# Patient Record
Sex: Female | Born: 1975 | Race: Black or African American | Hispanic: No | Marital: Single | Smoking: Never smoker
Health system: Southern US, Community
[De-identification: ages and names within clinical notes are randomized; demographics above are authoritative.]

## PROBLEM LIST (undated history)

## (undated) DIAGNOSIS — R011 Cardiac murmur, unspecified: Secondary | ICD-10-CM

## (undated) DIAGNOSIS — I10 Essential (primary) hypertension: Secondary | ICD-10-CM

## (undated) DIAGNOSIS — J302 Other seasonal allergic rhinitis: Secondary | ICD-10-CM

## (undated) DIAGNOSIS — G43909 Migraine, unspecified, not intractable, without status migrainosus: Secondary | ICD-10-CM

## (undated) DIAGNOSIS — S83209A Unspecified tear of unspecified meniscus, current injury, unspecified knee, initial encounter: Secondary | ICD-10-CM

## (undated) DIAGNOSIS — K219 Gastro-esophageal reflux disease without esophagitis: Secondary | ICD-10-CM

## (undated) HISTORY — PX: TRANSTHORACIC ECHOCARDIOGRAM: SHX275

---

## 1997-05-24 ENCOUNTER — Encounter: Admission: RE | Admit: 1997-05-24 | Discharge: 1997-05-24 | Payer: Self-pay | Admitting: Family Medicine

## 1997-06-05 ENCOUNTER — Encounter: Admission: RE | Admit: 1997-06-05 | Discharge: 1997-06-05 | Payer: Self-pay | Admitting: Family Medicine

## 1997-08-15 ENCOUNTER — Encounter: Admission: RE | Admit: 1997-08-15 | Discharge: 1997-08-15 | Payer: Self-pay | Admitting: Sports Medicine

## 1997-08-29 ENCOUNTER — Encounter: Admission: RE | Admit: 1997-08-29 | Discharge: 1997-08-29 | Payer: Self-pay | Admitting: Family Medicine

## 1998-01-16 ENCOUNTER — Encounter: Admission: RE | Admit: 1998-01-16 | Discharge: 1998-01-16 | Payer: Self-pay | Admitting: Family Medicine

## 1998-02-02 ENCOUNTER — Encounter: Admission: RE | Admit: 1998-02-02 | Discharge: 1998-02-02 | Payer: Self-pay | Admitting: Family Medicine

## 1998-03-20 ENCOUNTER — Encounter: Admission: RE | Admit: 1998-03-20 | Discharge: 1998-03-20 | Payer: Self-pay | Admitting: Family Medicine

## 1998-06-11 ENCOUNTER — Other Ambulatory Visit: Admission: RE | Admit: 1998-06-11 | Discharge: 1998-06-11 | Payer: Self-pay | Admitting: *Deleted

## 1998-07-13 ENCOUNTER — Encounter: Admission: RE | Admit: 1998-07-13 | Discharge: 1998-07-13 | Payer: Self-pay | Admitting: Family Medicine

## 1999-01-24 ENCOUNTER — Encounter: Admission: RE | Admit: 1999-01-24 | Discharge: 1999-01-24 | Payer: Self-pay | Admitting: Family Medicine

## 1999-02-27 ENCOUNTER — Encounter: Admission: RE | Admit: 1999-02-27 | Discharge: 1999-02-27 | Payer: Self-pay | Admitting: Family Medicine

## 1999-06-24 ENCOUNTER — Other Ambulatory Visit: Admission: RE | Admit: 1999-06-24 | Discharge: 1999-06-24 | Payer: Self-pay | Admitting: Obstetrics and Gynecology

## 1999-07-08 ENCOUNTER — Encounter: Admission: RE | Admit: 1999-07-08 | Discharge: 1999-07-08 | Payer: Self-pay | Admitting: Sports Medicine

## 1999-07-10 ENCOUNTER — Encounter: Admission: RE | Admit: 1999-07-10 | Discharge: 1999-07-10 | Payer: Self-pay | Admitting: Family Medicine

## 2000-05-18 ENCOUNTER — Encounter: Admission: RE | Admit: 2000-05-18 | Discharge: 2000-05-18 | Payer: Self-pay | Admitting: Family Medicine

## 2000-05-22 ENCOUNTER — Encounter: Admission: RE | Admit: 2000-05-22 | Discharge: 2000-05-22 | Payer: Self-pay | Admitting: Family Medicine

## 2000-06-22 ENCOUNTER — Encounter: Admission: RE | Admit: 2000-06-22 | Discharge: 2000-06-22 | Payer: Self-pay | Admitting: Family Medicine

## 2000-08-14 ENCOUNTER — Other Ambulatory Visit: Admission: RE | Admit: 2000-08-14 | Discharge: 2000-08-14 | Payer: Self-pay | Admitting: Obstetrics and Gynecology

## 2000-10-16 ENCOUNTER — Ambulatory Visit (HOSPITAL_BASED_OUTPATIENT_CLINIC_OR_DEPARTMENT_OTHER): Admission: RE | Admit: 2000-10-16 | Discharge: 2000-10-16 | Payer: Self-pay | Admitting: Otolaryngology

## 2000-10-16 HISTORY — PX: NASAL SINUS SURGERY: SHX719

## 2001-09-03 ENCOUNTER — Other Ambulatory Visit: Admission: RE | Admit: 2001-09-03 | Discharge: 2001-09-03 | Payer: Self-pay | Admitting: Obstetrics and Gynecology

## 2002-09-08 ENCOUNTER — Encounter: Admission: RE | Admit: 2002-09-08 | Discharge: 2002-09-08 | Payer: Self-pay | Admitting: Family Medicine

## 2003-02-01 ENCOUNTER — Encounter: Admission: RE | Admit: 2003-02-01 | Discharge: 2003-02-01 | Payer: Self-pay | Admitting: Family Medicine

## 2003-05-24 ENCOUNTER — Encounter: Admission: RE | Admit: 2003-05-24 | Discharge: 2003-05-24 | Payer: Self-pay | Admitting: Family Medicine

## 2003-12-01 ENCOUNTER — Ambulatory Visit: Payer: Self-pay | Admitting: Family Medicine

## 2004-11-20 ENCOUNTER — Encounter (INDEPENDENT_AMBULATORY_CARE_PROVIDER_SITE_OTHER): Payer: Self-pay | Admitting: *Deleted

## 2004-11-20 LAB — CONVERTED CEMR LAB

## 2004-11-25 ENCOUNTER — Ambulatory Visit: Payer: Self-pay | Admitting: Family Medicine

## 2004-11-25 ENCOUNTER — Encounter (INDEPENDENT_AMBULATORY_CARE_PROVIDER_SITE_OTHER): Payer: Self-pay | Admitting: *Deleted

## 2004-11-26 ENCOUNTER — Ambulatory Visit (HOSPITAL_COMMUNITY): Admission: RE | Admit: 2004-11-26 | Discharge: 2004-11-26 | Payer: Self-pay | Admitting: Family Medicine

## 2004-11-28 ENCOUNTER — Inpatient Hospital Stay (HOSPITAL_COMMUNITY): Admission: AD | Admit: 2004-11-28 | Discharge: 2004-11-28 | Payer: Self-pay | Admitting: Obstetrics and Gynecology

## 2004-12-02 ENCOUNTER — Ambulatory Visit: Payer: Self-pay | Admitting: Family Medicine

## 2004-12-03 ENCOUNTER — Inpatient Hospital Stay (HOSPITAL_COMMUNITY): Admission: AD | Admit: 2004-12-03 | Discharge: 2004-12-03 | Payer: Self-pay | Admitting: *Deleted

## 2004-12-03 ENCOUNTER — Ambulatory Visit: Payer: Self-pay | Admitting: *Deleted

## 2004-12-03 ENCOUNTER — Encounter: Admission: RE | Admit: 2004-12-03 | Discharge: 2004-12-03 | Payer: Self-pay | Admitting: Sports Medicine

## 2004-12-04 ENCOUNTER — Encounter (INDEPENDENT_AMBULATORY_CARE_PROVIDER_SITE_OTHER): Payer: Self-pay | Admitting: Specialist

## 2004-12-04 ENCOUNTER — Ambulatory Visit: Payer: Self-pay | Admitting: Obstetrics and Gynecology

## 2004-12-04 ENCOUNTER — Ambulatory Visit (HOSPITAL_COMMUNITY): Admission: RE | Admit: 2004-12-04 | Discharge: 2004-12-04 | Payer: Self-pay | Admitting: Obstetrics and Gynecology

## 2004-12-04 HISTORY — PX: OTHER SURGICAL HISTORY: SHX169

## 2004-12-24 ENCOUNTER — Ambulatory Visit: Payer: Self-pay | Admitting: Sports Medicine

## 2004-12-27 ENCOUNTER — Ambulatory Visit: Payer: Self-pay | Admitting: Family Medicine

## 2005-05-20 ENCOUNTER — Ambulatory Visit: Payer: Self-pay | Admitting: Family Medicine

## 2005-08-19 ENCOUNTER — Ambulatory Visit: Payer: Self-pay | Admitting: Family Medicine

## 2005-09-16 ENCOUNTER — Ambulatory Visit: Payer: Self-pay | Admitting: Family Medicine

## 2006-03-19 DIAGNOSIS — J309 Allergic rhinitis, unspecified: Secondary | ICD-10-CM | POA: Insufficient documentation

## 2006-03-20 ENCOUNTER — Encounter (INDEPENDENT_AMBULATORY_CARE_PROVIDER_SITE_OTHER): Payer: Self-pay | Admitting: *Deleted

## 2007-02-22 ENCOUNTER — Encounter: Payer: Self-pay | Admitting: Family Medicine

## 2007-02-22 ENCOUNTER — Ambulatory Visit: Payer: Self-pay | Admitting: Family Medicine

## 2007-02-22 LAB — CONVERTED CEMR LAB
ALT: 10 units/L (ref 0–35)
AST: 12 units/L (ref 0–37)
Albumin: 4.2 g/dL (ref 3.5–5.2)
Alkaline Phosphatase: 84 units/L (ref 39–117)
BUN: 13 mg/dL (ref 6–23)
CO2: 20 meq/L (ref 19–32)
Calcium: 9.6 mg/dL (ref 8.4–10.5)
Chloride: 108 meq/L (ref 96–112)
Cholesterol: 156 mg/dL (ref 0–200)
Creatinine, Ser: 0.85 mg/dL (ref 0.40–1.20)
Glucose, Bld: 90 mg/dL (ref 70–99)
HDL: 50 mg/dL (ref >39)
LDL Cholesterol: 91 mg/dL (ref 0–99)
Potassium: 4.1 meq/L (ref 3.5–5.3)
Sodium: 140 meq/L (ref 135–145)
TSH: 1.085 microintl units/mL (ref 0.350–5.50)
Total Bilirubin: 0.4 mg/dL (ref 0.3–1.2)
Total CHOL/HDL Ratio: 3.1
Total Protein: 7.3 g/dL (ref 6.0–8.3)
Triglycerides: 76 mg/dL (ref <150)
VLDL: 15 mg/dL (ref 0–40)

## 2007-02-24 ENCOUNTER — Encounter: Payer: Self-pay | Admitting: Family Medicine

## 2007-04-09 ENCOUNTER — Telehealth: Payer: Self-pay | Admitting: *Deleted

## 2007-05-21 HISTORY — PX: KNEE ARTHROSCOPY: SUR90

## 2007-07-29 ENCOUNTER — Ambulatory Visit: Payer: Self-pay | Admitting: Family Medicine

## 2008-01-11 ENCOUNTER — Ambulatory Visit: Payer: Self-pay | Admitting: Family Medicine

## 2008-01-11 ENCOUNTER — Telehealth: Payer: Self-pay | Admitting: Family Medicine

## 2008-03-06 ENCOUNTER — Encounter: Payer: Self-pay | Admitting: Family Medicine

## 2008-03-06 ENCOUNTER — Ambulatory Visit: Payer: Self-pay | Admitting: Family Medicine

## 2008-03-10 ENCOUNTER — Telehealth: Payer: Self-pay | Admitting: *Deleted

## 2008-03-28 ENCOUNTER — Encounter: Payer: Self-pay | Admitting: Family Medicine

## 2008-03-28 ENCOUNTER — Ambulatory Visit: Payer: Self-pay | Admitting: Family Medicine

## 2008-04-21 ENCOUNTER — Encounter: Payer: Self-pay | Admitting: Family Medicine

## 2008-06-12 ENCOUNTER — Encounter: Payer: Self-pay | Admitting: Family Medicine

## 2008-09-26 ENCOUNTER — Telehealth: Payer: Self-pay | Admitting: Family Medicine

## 2008-10-02 ENCOUNTER — Encounter: Payer: Self-pay | Admitting: Family Medicine

## 2008-10-02 ENCOUNTER — Ambulatory Visit: Payer: Self-pay | Admitting: Family Medicine

## 2008-10-02 LAB — CONVERTED CEMR LAB
Antibody Screen: NEGATIVE
Basophils Absolute: 0 10*3/uL (ref 0.0–0.1)
Basophils Relative: 0 % (ref 0–1)
Eosinophils Absolute: 0.2 10*3/uL (ref 0.0–0.7)
Eosinophils Relative: 3 % (ref 0–5)
HCT: 34.5 % — ABNORMAL LOW (ref 36.0–46.0)
Hemoglobin: 10.2 g/dL — ABNORMAL LOW (ref 12.0–15.0)
Hepatitis B Surface Ag: NEGATIVE
Lymphocytes Relative: 29 % (ref 12–46)
Lymphs Abs: 1.7 10*3/uL (ref 0.7–4.0)
MCHC: 29.6 g/dL — ABNORMAL LOW (ref 30.0–36.0)
MCV: 73.7 fL — ABNORMAL LOW (ref 78.0–100.0)
Monocytes Absolute: 0.3 10*3/uL (ref 0.1–1.0)
Monocytes Relative: 5 % (ref 3–12)
Neutro Abs: 3.6 10*3/uL (ref 1.7–7.7)
Neutrophils Relative %: 63 % (ref 43–77)
Platelets: 322 10*3/uL (ref 150–400)
RBC: 4.68 M/uL (ref 3.87–5.11)
RDW: 17.2 % — ABNORMAL HIGH (ref 11.5–15.5)
Rh Type: POSITIVE
Rubella: 38.8 intl units/mL — ABNORMAL HIGH
Sickle Cell Screen: NEGATIVE
WBC: 5.8 10*3/uL (ref 4.0–10.5)

## 2008-10-03 ENCOUNTER — Encounter: Payer: Self-pay | Admitting: Family Medicine

## 2008-10-09 ENCOUNTER — Other Ambulatory Visit: Admission: RE | Admit: 2008-10-09 | Discharge: 2008-10-09 | Payer: Self-pay | Admitting: Family Medicine

## 2008-10-09 ENCOUNTER — Encounter: Payer: Self-pay | Admitting: Family Medicine

## 2008-10-09 ENCOUNTER — Ambulatory Visit: Payer: Self-pay | Admitting: Family Medicine

## 2008-10-09 DIAGNOSIS — A6000 Herpesviral infection of urogenital system, unspecified: Secondary | ICD-10-CM | POA: Insufficient documentation

## 2008-10-09 LAB — CONVERTED CEMR LAB
Bilirubin Urine: NEGATIVE
Chlamydia, DNA Probe: NEGATIVE
GC Probe Amp, Genital: NEGATIVE
Glucose, Urine, Semiquant: NEGATIVE
Ketones, urine, test strip: NEGATIVE
Nitrite: NEGATIVE
Protein, U semiquant: NEGATIVE
Specific Gravity, Urine: 1.015
Urobilinogen, UA: 0.2
WBC Urine, dipstick: NEGATIVE
pH: 7

## 2008-10-11 ENCOUNTER — Encounter: Payer: Self-pay | Admitting: Family Medicine

## 2008-10-13 ENCOUNTER — Encounter: Payer: Self-pay | Admitting: Family Medicine

## 2008-10-13 ENCOUNTER — Ambulatory Visit (HOSPITAL_COMMUNITY): Admission: RE | Admit: 2008-10-13 | Discharge: 2008-10-13 | Payer: Self-pay | Admitting: Family Medicine

## 2008-10-19 ENCOUNTER — Telehealth: Payer: Self-pay | Admitting: *Deleted

## 2008-11-07 ENCOUNTER — Encounter: Payer: Self-pay | Admitting: Family Medicine

## 2008-11-07 ENCOUNTER — Ambulatory Visit: Payer: Self-pay | Admitting: Family Medicine

## 2008-11-07 DIAGNOSIS — K219 Gastro-esophageal reflux disease without esophagitis: Secondary | ICD-10-CM

## 2008-11-09 ENCOUNTER — Telehealth: Payer: Self-pay | Admitting: Family Medicine

## 2008-11-13 ENCOUNTER — Ambulatory Visit (HOSPITAL_COMMUNITY): Admission: RE | Admit: 2008-11-13 | Discharge: 2008-11-13 | Payer: Self-pay | Admitting: Family Medicine

## 2008-11-13 ENCOUNTER — Encounter: Payer: Self-pay | Admitting: Family Medicine

## 2008-11-14 ENCOUNTER — Encounter: Payer: Self-pay | Admitting: Family Medicine

## 2008-11-27 ENCOUNTER — Telehealth: Payer: Self-pay | Admitting: Family Medicine

## 2008-11-27 ENCOUNTER — Encounter: Payer: Self-pay | Admitting: *Deleted

## 2008-12-04 ENCOUNTER — Encounter: Payer: Self-pay | Admitting: Family Medicine

## 2008-12-04 ENCOUNTER — Ambulatory Visit: Payer: Self-pay | Admitting: Family Medicine

## 2008-12-11 ENCOUNTER — Ambulatory Visit (HOSPITAL_COMMUNITY): Admission: RE | Admit: 2008-12-11 | Discharge: 2008-12-11 | Payer: Self-pay | Admitting: Family Medicine

## 2008-12-13 ENCOUNTER — Ambulatory Visit: Payer: Self-pay | Admitting: Family Medicine

## 2008-12-13 ENCOUNTER — Encounter: Payer: Self-pay | Admitting: Family Medicine

## 2008-12-18 ENCOUNTER — Telehealth: Payer: Self-pay | Admitting: *Deleted

## 2009-01-02 ENCOUNTER — Ambulatory Visit: Payer: Self-pay | Admitting: Family Medicine

## 2009-01-02 ENCOUNTER — Encounter: Payer: Self-pay | Admitting: Family Medicine

## 2009-01-10 ENCOUNTER — Encounter: Payer: Self-pay | Admitting: Family Medicine

## 2009-01-10 ENCOUNTER — Ambulatory Visit (HOSPITAL_COMMUNITY): Admission: RE | Admit: 2009-01-10 | Discharge: 2009-01-10 | Payer: Self-pay | Admitting: Family Medicine

## 2009-01-22 ENCOUNTER — Inpatient Hospital Stay (HOSPITAL_COMMUNITY): Admission: AD | Admit: 2009-01-22 | Discharge: 2009-01-22 | Payer: Self-pay | Admitting: Obstetrics and Gynecology

## 2009-01-22 ENCOUNTER — Ambulatory Visit: Payer: Self-pay | Admitting: Obstetrics and Gynecology

## 2009-01-31 ENCOUNTER — Ambulatory Visit: Payer: Self-pay | Admitting: Family Medicine

## 2009-01-31 ENCOUNTER — Encounter: Payer: Self-pay | Admitting: Family Medicine

## 2009-02-22 ENCOUNTER — Telehealth: Payer: Self-pay | Admitting: Family Medicine

## 2009-02-27 ENCOUNTER — Ambulatory Visit: Payer: Self-pay | Admitting: Family Medicine

## 2009-02-27 ENCOUNTER — Encounter: Payer: Self-pay | Admitting: Family Medicine

## 2009-02-27 LAB — CONVERTED CEMR LAB
HCT: 37.1 % (ref 36.0–46.0)
Hemoglobin: 12.2 g/dL (ref 12.0–15.0)
MCHC: 32.9 g/dL (ref 30.0–36.0)
MCV: 85.5 fL (ref 78.0–100.0)
Platelets: 219 10*3/uL (ref 150–400)
RBC: 4.34 M/uL (ref 3.87–5.11)
RDW: 14.5 % (ref 11.5–15.5)
WBC: 7.8 10*3/uL (ref 4.0–10.5)

## 2009-03-16 ENCOUNTER — Ambulatory Visit: Payer: Self-pay | Admitting: Family Medicine

## 2009-03-27 ENCOUNTER — Telehealth: Payer: Self-pay | Admitting: Family Medicine

## 2009-04-03 ENCOUNTER — Ambulatory Visit: Payer: Self-pay | Admitting: Family Medicine

## 2009-04-17 ENCOUNTER — Ambulatory Visit: Payer: Self-pay | Admitting: Family Medicine

## 2009-04-24 ENCOUNTER — Ambulatory Visit: Payer: Self-pay | Admitting: Family Medicine

## 2009-04-24 ENCOUNTER — Inpatient Hospital Stay (HOSPITAL_COMMUNITY): Admission: AD | Admit: 2009-04-24 | Discharge: 2009-04-24 | Payer: Self-pay | Admitting: Obstetrics & Gynecology

## 2009-05-02 ENCOUNTER — Ambulatory Visit: Payer: Self-pay | Admitting: Family Medicine

## 2009-05-02 ENCOUNTER — Encounter: Payer: Self-pay | Admitting: Family Medicine

## 2009-05-02 LAB — CONVERTED CEMR LAB
Chlamydia, DNA Probe: NEGATIVE
GC Probe Amp, Genital: NEGATIVE
Glucose, Urine, Semiquant: NEGATIVE
Protein, U semiquant: NEGATIVE

## 2009-05-10 ENCOUNTER — Ambulatory Visit: Payer: Self-pay | Admitting: Family Medicine

## 2009-05-14 ENCOUNTER — Inpatient Hospital Stay (HOSPITAL_COMMUNITY): Admission: AD | Admit: 2009-05-14 | Discharge: 2009-05-15 | Payer: Self-pay | Admitting: Family Medicine

## 2009-05-14 ENCOUNTER — Telehealth: Payer: Self-pay | Admitting: Family Medicine

## 2009-05-15 ENCOUNTER — Encounter: Payer: Self-pay | Admitting: Family Medicine

## 2009-05-16 ENCOUNTER — Inpatient Hospital Stay (HOSPITAL_COMMUNITY): Admission: AD | Admit: 2009-05-16 | Discharge: 2009-05-18 | Payer: Self-pay | Admitting: Obstetrics and Gynecology

## 2009-05-16 ENCOUNTER — Ambulatory Visit: Payer: Self-pay | Admitting: Family

## 2009-05-22 ENCOUNTER — Inpatient Hospital Stay (HOSPITAL_COMMUNITY): Admission: AD | Admit: 2009-05-22 | Discharge: 2009-05-25 | Payer: Self-pay | Admitting: Family Medicine

## 2009-05-22 ENCOUNTER — Ambulatory Visit: Payer: Self-pay | Admitting: Family Medicine

## 2009-05-22 ENCOUNTER — Encounter: Payer: Self-pay | Admitting: Family Medicine

## 2009-05-31 ENCOUNTER — Ambulatory Visit: Payer: Self-pay | Admitting: Family Medicine

## 2009-06-15 ENCOUNTER — Telehealth: Payer: Self-pay | Admitting: Family Medicine

## 2009-07-02 ENCOUNTER — Ambulatory Visit: Payer: Self-pay | Admitting: Family Medicine

## 2009-07-25 ENCOUNTER — Telehealth: Payer: Self-pay | Admitting: Family Medicine

## 2009-08-03 ENCOUNTER — Ambulatory Visit: Payer: Self-pay | Admitting: Family Medicine

## 2009-09-26 ENCOUNTER — Ambulatory Visit: Payer: Self-pay | Admitting: Family Medicine

## 2009-09-26 DIAGNOSIS — L600 Ingrowing nail: Secondary | ICD-10-CM

## 2009-10-11 ENCOUNTER — Ambulatory Visit: Payer: Self-pay | Admitting: Family Medicine

## 2009-10-15 ENCOUNTER — Telehealth: Payer: Self-pay | Admitting: Family Medicine

## 2009-10-31 ENCOUNTER — Ambulatory Visit: Payer: Self-pay | Admitting: Family Medicine

## 2009-10-31 LAB — CONVERTED CEMR LAB: Pap Smear: NEGATIVE

## 2009-11-06 ENCOUNTER — Encounter: Payer: Self-pay | Admitting: Family Medicine

## 2009-11-23 ENCOUNTER — Telehealth: Payer: Self-pay | Admitting: Family Medicine

## 2009-11-23 ENCOUNTER — Encounter: Payer: Self-pay | Admitting: Family Medicine

## 2009-12-31 ENCOUNTER — Telehealth (INDEPENDENT_AMBULATORY_CARE_PROVIDER_SITE_OTHER): Payer: Self-pay | Admitting: Family Medicine

## 2010-01-17 ENCOUNTER — Ambulatory Visit: Payer: Self-pay | Admitting: Family Medicine

## 2010-01-17 ENCOUNTER — Encounter: Payer: Self-pay | Admitting: Family Medicine

## 2010-01-31 ENCOUNTER — Ambulatory Visit: Admission: RE | Admit: 2010-01-31 | Discharge: 2010-01-31 | Payer: Self-pay | Source: Home / Self Care

## 2010-01-31 DIAGNOSIS — J0100 Acute maxillary sinusitis, unspecified: Secondary | ICD-10-CM | POA: Insufficient documentation

## 2010-02-10 ENCOUNTER — Encounter: Payer: Self-pay | Admitting: Family Medicine

## 2010-02-19 NOTE — Assessment & Plan Note (Signed)
Summary: pap & depo/eo   Vital Signs:  Patient profile:   35 year old female Height:      61.5 inches Weight:      225.4 pounds BMI:     42.05 Temp:     99.4 degrees F oral Pulse rate:   80 / minute BP sitting:   104 / 71  (left arm) Cuff size:   large  Vitals Entered By: Garen Grams LPN (October 31, 2009 2:05 PM) CC: CPP Is Patient Diabetic? No Pain Assessment Patient in pain? no        Primary Care Provider:  Angelena Sole MD  CC:  CPP.  History of Present Illness: Physical exam  Questions / Concerns 1. Ingrown toenails:  overall doing much better 2. GERD: taking Zantac at night 3 times / week for acid reflux.  This helps.  Medical issues 1. HTN: taking her medicines as prescribed  Health Maintanence 1. Due for pap smear 2. Balanced diet 3. Doesn't exercise 4. Managing stress  Habits & Providers  Alcohol-Tobacco-Diet     Tobacco Status: never     Cigarette Packs/Day: n/a  Current Medications (verified): 1)  Acyclovir 400 Mg Tabs (Acyclovir) .Marland Kitchen.. 1 Tab By Mouth Three Times A Day Starting At [redacted] Weeks Gestation 2)  P D Natal Vitamins/folic Acid  Tabs (Prenatal Multivit-Min-Fe-Fa) .... Take One Tablet By Mouth Daily 3)  Fioricet 50-325-40 Mg Tabs (Butalbital-Apap-Caffeine) .Marland Kitchen.. 1-2 Tabs Q4h As Needed For Migraine Headache, No More Than 6 Tabs Per Day 4)  Fluticasone Propionate 50 Mcg/act Susp (Fluticasone Propionate) .... 2 Sprays Each Nostril Once Daily 5)  Zyrtec Allergy 10 Mg Tabs (Cetirizine Hcl) .Marland Kitchen.. 1 Tab By Mouth Daily For Allergies 6)  Ferrous Sulfate 325 (65 Fe) Mg Tabs (Ferrous Sulfate) .Marland Kitchen.. 1 Tab By Mouth 1-2 Times A Day For Anemia 7)  Zantac 150 Maximum Strength 150 Mg Tabs (Ranitidine Hcl) .... Take 1 Tab By Mouth Daily 8)  Hydrochlorothiazide 25 Mg Tabs (Hydrochlorothiazide) .... Take 1 Tab By Mouth Daily 9)  Labetalol Hcl 200 Mg Tabs (Labetalol Hcl) .Marland Kitchen.. 1 Tab By Mouth Two Times A Day  Allergies: No Known Drug Allergies  Past  History:  Past Medical History: Reviewed history from 07/02/2009 and no changes required. HTN hx abnormal pap,  Steroid injection of R carpal tunnel 1/05, 5/05  Social History: Reviewed history from 07/02/2009 and no changes required. 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  The patient denies fever, weight loss, decreased hearing, hoarseness, chest pain, dyspnea on exertion, peripheral edema, prolonged cough, headaches, and abdominal pain.    Physical Exam  General:  vitals reviewed.  obese. no acute distress Eyes:  vision grossly intact.  PERRL Nose:  no external deformity and no nasal discharge.   Mouth:  good dentition and pharynx pink and moist.   Neck:  supple, full ROM, and no masses.   Lungs:  normal respiratory effort.   Heart:  normal rate and regular rhythm.   Soft systolic murmur Abdomen:  S / NT / ND.  +BS Genitalia:  normal introitus, no external lesions, no vaginal discharge, and mucosa pink and moist.   Msk:  normal ROM, no joint tenderness, and no joint swelling.   Extremities:  no lower extremity edema Neurologic:  alert & oriented X3, cranial nerves II-XII intact, strength normal in all extremities,  and sensation intact to light touch.   Skin:  turgor normal and color normal.   Psych:  normally interactive, good eye contact, not anxious appearing, and not depressed appearing.     Impression & Recommendations:  Problem # 1:  Preventive Health Care (ICD-V70.0) Assessment Unchanged Doing well.  Routine care.  PAP smear performed today  Problem # 2:  INGROWN TOENAIL, INFECTED (ICD-703.0) Assessment: Improved continue to monitor  Problem # 3:  GERD (ICD-530.81) Assessment: Deteriorated continue Zantac for now.  May need Omeprazole. Her updated medication list for this problem includes:    Zantac 150 Maximum  Strength 150 Mg Tabs (Ranitidine hcl) .Marland Kitchen... Take 1 tab by mouth daily  Complete Medication List: 1)  Acyclovir 400 Mg Tabs (Acyclovir) .Marland Kitchen.. 1 tab by mouth three times a day starting at [redacted] weeks gestation 2)  P D Natal Vitamins/folic Acid Tabs (Prenatal multivit-min-fe-fa) .... Take one tablet by mouth daily 3)  Fioricet 50-325-40 Mg Tabs (Butalbital-apap-caffeine) .Marland Kitchen.. 1-2 tabs q4h as needed for migraine headache, no more than 6 tabs per day 4)  Fluticasone Propionate 50 Mcg/act Susp (Fluticasone propionate) .... 2 sprays each nostril once daily 5)  Zyrtec Allergy 10 Mg Tabs (Cetirizine hcl) .Marland Kitchen.. 1 tab by mouth daily for allergies 6)  Ferrous Sulfate 325 (65 Fe) Mg Tabs (Ferrous sulfate) .Marland Kitchen.. 1 tab by mouth 1-2 times a day for anemia 7)  Zantac 150 Maximum Strength 150 Mg Tabs (Ranitidine hcl) .... Take 1 tab by mouth daily 8)  Hydrochlorothiazide 25 Mg Tabs (Hydrochlorothiazide) .... Take 1 tab by mouth daily 9)  Labetalol Hcl 200 Mg Tabs (Labetalol hcl) .Marland Kitchen.. 1 tab by mouth two times a day  Other Orders: Pap Smear-[facility] ([address]109-60454) Depo-Provera 150mg  (J1055) [facility] - Est  18-39 yrs (09811)   Medication Administration  Injection # 1:    Medication: Depo-Provera 150mg     Diagnosis: CONTRACEPTIVE MANAGEMENT (ICD-V25.09)    Route: IM    Site: LUOQ gluteus    Exp Date: 03/20/2012    Lot #: [id number]    Mfr: Francisca December    Comments: Next Depo Due: December 12 - January 11    Patient tolerated injection without complications    Given by: Garen Grams LPN (October 31, 2009 2:51 PM)  Orders Added: 1)  Pap Smear-[facility] [[id number]] 2)  Depo-Provera 150mg  [J1055] 3)  [facility] - Est  18-39 yrs [78469]

## 2010-02-19 NOTE — Assessment & Plan Note (Signed)
Summary: ob visit/eo   Vital Signs:  Patient profile:   35 year old female Weight:      227 pounds BP sitting:   130 / 77  Vitals Entered By: Jone Baseman CMA (March 16, 2009 9:43 AM)  Primary Care Provider:  Norton Blizzard MD   History of Present Illness: 1. Carpal Tunnel Syndrome:  Seems to be getting worse.  Purchased two wrist braces to use during work and at night.  These seem to help a little bit.  She is also taking Tylenol but only as needed.  2. Sinusitis:  Improved.  Still having some nasal congestion but this is thought to be due to  3. Nipple leakage:  Has started to notice some fluid starting to come from her breasts.  Her breasts also feel a lot larger.  4. Pregnancy:   -Epidural -Breast feed -Micronor (Doesn't want IUD or Depo) -Wants baby to follow up with [facility]  Habits & Providers  Alcohol-Tobacco-Diet     Cigarette Packs/Day: n/a  Current Medications (verified): 1)  Acyclovir 400 Mg Tabs (Acyclovir) .Marland Kitchen.. 1 Tab By Mouth Three Times A Day Starting At [redacted] Weeks Gestation 2)  P D Natal Vitamins/folic Acid  Tabs (Prenatal Multivit-Min-Fe-Fa) .... Take One Tablet By Mouth Daily 3)  Fioricet 50-325-40 Mg Tabs (Butalbital-Apap-Caffeine) .Marland Kitchen.. 1-2 Tabs Q4h As Needed For Migraine Headache, No More Than 6 Tabs Per Day 4)  Fluticasone Propionate 50 Mcg/act Susp (Fluticasone Propionate) .... 2 Sprays Each Nostril Once Daily 5)  Zyrtec Allergy 10 Mg Tabs (Cetirizine Hcl) .Marland Kitchen.. 1 Tab By Mouth Daily For Allergies 6)  Ferrous Sulfate 325 (65 Fe) Mg Tabs (Ferrous Sulfate) .Marland Kitchen.. 1 Tab By Mouth 1-2 Times A Day For Anemia 7)  Zantac 150 Maximum Strength 150 Mg Tabs (Ranitidine Hcl) .... Take 1 Tab By Mouth Daily 8)  Amoxicillin 500 Mg Tabs (Amoxicillin) .... Take 1 Tab By Mouth Two Times A Day For 10 Days  Allergies: No Known Drug Allergies  Past History:  Past Medical History: Reviewed history from 03/06/2008 and no changes required. hx abnormal pap,    Steroid injection of R carpal tunnel 1/05, 5/05  Social History: Reviewed history from 03/19/2006 and no changes required. 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   Mouth:  good dentition and pharynx pink and moist.   Neck:  supple and no masses.   Lungs:  normal respiratory effort, normal breath sounds, and no wheezes.   Heart:  normal rate, regular rhythm, and no murmur.   Abdomen:  S / NT / ND  Gravid c/w dates Msk:  bilateral wrists + Tinnels Extremities:  trace lower extremity edema Skin:  turgor normal and color normal.   Psych:  not anxious appearing and not depressed appearing.     Impression & Recommendations:  Problem # 1:  PREGNANCY (ICD-V22.2) Assessment Unchanged Encouraged her to monitor her diet more closely.  She is gainig a little bit too much weight. Follow up in 2 weeks -Epidural -Breast feed -Micronor (Doesn't want IUD or Depo) -Wants baby to follow up with [facility]  Orders: Other OB visit- [facility] (OBCK)  Problem # 2:  CARPAL TUNNEL SYNDROME (ICD-354.0) Assessment: Deteriorated  Continue wrist braces / Tylenol.  Did not recommend injection at this time.  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  Now with just allergies. The following medications were removed from the medication list:    Amoxicillin 500 Mg Tabs (Amoxicillin) .Marland Kitchen... Take 1 tab by mouth two times a day for 10 days Her updated medication list for this problem includes:    Fluticasone Propionate 50 Mcg/act Susp (Fluticasone propionate) .Marland Kitchen... 2 sprays each nostril once daily  Orders: Other OB visit- [facility] (OBCK)  Complete Medication List: 1)  Acyclovir 400 Mg Tabs (Acyclovir) .Marland Kitchen.. 1 tab by mouth three times a day starting  at [redacted] weeks gestation 2)  P D Natal Vitamins/folic Acid Tabs (Prenatal multivit-min-fe-fa) .... Take one tablet by mouth daily 3)  Fioricet 50-325-40 Mg Tabs (Butalbital-apap-caffeine) .Marland Kitchen.. 1-2 tabs q4h as needed for migraine headache, no more than 6 tabs per day 4)  Fluticasone Propionate 50 Mcg/act Susp (Fluticasone propionate) .... 2 sprays each nostril once daily 5)  Zyrtec Allergy 10 Mg Tabs (Cetirizine hcl) .Marland Kitchen.. 1 tab by mouth daily for allergies 6)  Ferrous Sulfate 325 (65 Fe) Mg Tabs (Ferrous sulfate) .Marland Kitchen.. 1 tab by mouth 1-2 times a day for anemia 7)  Zantac 150 Maximum Strength 150 Mg Tabs (Ranitidine hcl) .... Take 1 tab by mouth daily  Patient Instructions: 1)  You are doing great 2)  We will not make any changes to your treatment today 3)  Continue to use the wrist braces and Tylenol.  You can take it up to every 4 hours. 4)  Please schedule a follow up appointment in 2 weeks.    Flowsheet View for Follow-up Visit    Estimated weeks of       gestation:     29 5/7    Weight:     227    Blood pressure:   130 / 77    Headache:     No    Nausea/vomiting:   No    Edema:     TrLE    Vaginal bleeding:   no    Vaginal discharge:   no    Fundal height:      30    FHR:       150    Fetal activity:     yes    Labor symptoms:   no    Taking prenatal vits?   Y    Smoking:     n/a    Next visit:     2 wk    Resident:     [name]    Preceptor:     Mauricio Po

## 2010-02-19 NOTE — Progress Notes (Signed)
Summary: triage  Phone Note Call from Patient Call back at [phone]   Caller: Patient Summary of Call: Her secretary has Shingles and was worried about the affect it would have on her since she is pregnant. Initial call taken by: Clydell Hakim,  March 27, 2009 1:44 PM  Follow-up for Phone Call        line remains busy Follow-up by: Golden Circle RN,  March 27, 2009 1:46 PM  Additional Follow-up for Phone Call Additional follow up Details #1::        her secretary has blisters that are covered by clothes. told her she was fine to work with her, just do not touch the fluid from the blisters Additional Follow-up by: Golden Circle RN,  March 27, 2009 1:56 PM

## 2010-02-19 NOTE — Assessment & Plan Note (Signed)
Summary: ob f/u   Flowsheet View for Follow-up Visit    Estimated weeks of       gestation:     27 2/7    Weight:     222    Blood pressure:   133 / 78    Hx headache?     No    Nausea/vomiting?   No    Edema?     0    Bleeding?     no    Leakage/discharge?   no    Fetal activity:       yes    Labor symptoms?   no    Fundal height:      27    FHR:       150    Taking Vitamins?   Y    Smoking PPD:   n/a    Comment:     Repeat 1 hour gtt    Next visit:     2 wk    Resident:     [name]    Preceptor:     Mauricio Po  Primary Care Provider:  Norton Blizzard MD   History of Present Illness: 1.Concerned that some of the kids in her school had CMV.  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  Reassured her that they are not active carrieres and that she can not get it from them  2. Carpal tunnel syndrome:  Is getting a little worse.  Has had steroid injections before.  Is wearing splints at night, not helping.  Does not want more medicine or injection at this point.  3. Sinusitis:  Has been dealing with sinus pain, pressure, and a cough for >1 week.  This happens to her about 2-3 times a year.  She tried to deal with it with OTC medicines but they don't seem to be helping.  She normally requires an antibiotic to clear the infection.   Physical Examination  Vital Signs:  BP (upright): 133/78  Wt: 222  Last Ht: 61.5 (10/09/2008)  General Exam:  Constitutional:    Well appering, obese Nose:    Maxillary sinus tenderness. External nasal examination shows no deformity or inflammation. Nasal mucosa are pink and moist without lesions or exudates. Mouth:    Oral mucosa and oropharynx without lesions or exudates.  Teeth in good repair. Cardiovascular:    Normal rate and regular rhythm. S1 and S2 normal without gallop, murmur, click, rub or other extra sounds. Respiratory:    no respiratory distress and clear to auscultation.   Abdomen:    S / NT/ gravid Extremities:    No lower  extremity edema  Impression & Recommendations:  Problem # 1:  PREGNANCY (ICD-V22.2) Repeat 1 hour GTT today and labs.  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 2 sprays each nostril once daily    Amoxicillin 500 Mg Tabs (Amoxicillin) .Marland Kitchen... Take 1 tab by mouth two times a day for 10 days  Orders: Other OB visit- [facility] (OBCK)  Problem # 3:  CARPAL TUNNEL SYNDROME (ICD-354.0) Assessment: Deteriorated  Continue to monitor.  Doesn't want other pain meds or injection.  Orders: Other OB visit- [facility] (OBCK)  Complete Medication List: 1)  Acyclovir  400 Mg Tabs (Acyclovir) .Marland Kitchen.. 1 tab by mouth three times a day starting at [redacted] weeks gestation 2)  P D Natal Vitamins/folic Acid Tabs (Prenatal multivit-min-fe-fa) .... Take one tablet by mouth daily 3)  Fioricet 50-325-40 Mg Tabs (Butalbital-apap-caffeine) .Marland Kitchen.. 1-2 tabs q4h as needed for migraine headache, no more than 6 tabs per day 4)  Fluticasone Propionate 50 Mcg/act Susp (Fluticasone propionate) .... 2 sprays each nostril once daily 5)  Zyrtec Allergy 10 Mg Tabs (Cetirizine hcl) .Marland Kitchen.. 1 tab by mouth daily for allergies 6)  Ferrous Sulfate 325 (65 Fe) Mg Tabs (Ferrous sulfate) .Marland Kitchen.. 1 tab by mouth 1-2 times a day for anemia 7)  Zantac 150 Maximum Strength 150 Mg Tabs (Ranitidine hcl) .... Take 1 tab by mouth daily 8)  Amoxicillin 500 Mg Tabs (Amoxicillin) .... Take 1 tab by mouth two times a day for 10 days  Patient Instructions: 1)  You are doing great 2)  We will treat your sinusitis with Amoxicillin, hopefully this helps you to feel better 3)  Your kids with CMV do not have an active infection so you do not need to worry about  contracting it 4)  Please follow up with Dr. Hulen Luster for your next OB appt in 2 weeks 5)  Come back to clinic sooner if not feeling better from your infection after the antibiotics  Flowsheet View for Follow-up Visit    Estimated weeks of       gestation:     27 2/7    Weight:     222    Blood pressure:   133 / 78    Headache:     No    Nausea/vomiting:   No    Edema:     0    Vaginal bleeding:   no    Vaginal discharge:   no    Fundal height:      27    FHR:       150    Fetal activity:     yes    Labor symptoms:   no    Taking prenatal vits?   Y    Smoking:     n/a    Next visit:     2 wk    Resident:     [name]    Preceptor:     Mauricio Po    Comment:     Repeat 1 hour gtt

## 2010-02-19 NOTE — Assessment & Plan Note (Signed)
Summary: depo,df  Nurse Visit   Allergies: No Known Drug Allergies  Medication Administration  Injection # 1:    Medication: Depo-Provera 150mg     Diagnosis: CONTRACEPTIVE MANAGEMENT (ICD-V25.09)    Route: IM    Site: RUOQ gluteus    Exp Date: 02/21/2012    Lot #: [id number]    Mfr: [email or web address]    Comments: Recieved fax from [facility], her last depo was given post delivery on 4.28.11.  Pt due for next depo Sept 30 - Oct 14    Patient tolerated injection without complications    Given by: Jone Baseman CMA (August 03, 2009 3:56 PM)  Orders Added: 1)  Depo-Provera 150mg  [J1055] 2)  Est Level 1- [facility] [04540]   Medication Administration  Injection # 1:    Medication: Depo-Provera 150mg     Diagnosis: CONTRACEPTIVE MANAGEMENT (ICD-V25.09)    Route: IM    Site: RUOQ gluteus    Exp Date: 02/21/2012    Lot #: [id number]    Mfr: greenstone    Comments: Recieved fax from [facility], her last depo was given post delivery on 4.28.11.  Pt due for next depo Sept 30 - Oct 14    Patient tolerated injection without complications    Given by: Jone Baseman CMA (August 03, 2009 3:56 PM)  Orders Added: 1)  Depo-Provera 150mg  [J1055] 2)  [facility] [47829]

## 2010-02-19 NOTE — Progress Notes (Signed)
Summary: refill  Phone Note Call from Patient Call back at Home Phone [phone]   Caller: Patient Summary of Call: has appt for June 13th and was given HCTZ in hosp - needs refill [facility] [place]  Initial call taken by: De Nurse,  Jun 15, 2009 1:37 PM    New/Updated Medications: HYDROCHLOROTHIAZIDE 25 MG TABS (HYDROCHLOROTHIAZIDE) Take 1 tab by mouth daily Prescriptions: HYDROCHLOROTHIAZIDE 25 MG TABS (HYDROCHLOROTHIAZIDE) Take 1 tab by mouth daily  #30 x 3   Entered and Authorized by:   Angelena Sole MD   Signed by:   Angelena Sole MD on 06/19/2009   Method used:   Electronically to        [facility] (retail)       [address]       [place], [place]  [address]       Ph: [phone]       Fax: [phone]   RxID:   [id number]

## 2010-02-19 NOTE — Miscellaneous (Signed)
Summary: Elevated BP  Clinical Lists Changes patient reports she delivered her baby 05/17/2009.   a short while ago she developed a nosebleed with clots . it has stopped now. she stopped in at [facility] to check her BP because of nosebleed.. BP was 166/127. advised her to go to  [facility]  ED now. she voices understanding  and will go now. Theresia Lo RN  May 22, 2009 4:31 PM

## 2010-02-19 NOTE — Assessment & Plan Note (Signed)
Summary: f/u eo   Vital Signs:  Patient profile:   35 year old female Height:      61.5 inches Weight:      222 pounds BMI:     41.42 BSA:     1.99 Temp:     99.4 degrees F Pulse rate:   79 / minute BP sitting:   110 / 74  Vitals Entered By: Jone Baseman CMA (October 11, 2009 9:20 AM) CC: F/U toe Is Patient Diabetic? No Pain Assessment Patient in pain? yes     Location: toe Intensity: 1   Primary Care Provider:  Angelena Sole MD  CC:  F/U toe.  History of Present Illness: 1. Bilateral ingrown toe nails:  Pt was seen about 2 weeks ago for painful big toe nails.  Diagnosed with infected ingrown toe nails, was given antibioitics and advised to use warm soaks and pull back the nail folds.  She took the antibiotics and had been doing the warm soaks as advised.  Overall she is doing much better.  The lateral toe folds are much less swollen, red, and no longer producing pus.  They are much less painful.  Only painful with manipulation of the distal, lateral component of the nail folds where the toe nail emerges.  ROS: denies skin infection or fevers  Habits & Providers  Alcohol-Tobacco-Diet     Tobacco Status: never     Cigarette Packs/Day: n/a  Current Medications (verified): 1)  Acyclovir 400 Mg Tabs (Acyclovir) .Marland Kitchen.. 1 Tab By Mouth Three Times A Day Starting At [redacted] Weeks Gestation 2)  P D Natal Vitamins/folic Acid  Tabs (Prenatal Multivit-Min-Fe-Fa) .... Take One Tablet By Mouth Daily 3)  Fioricet 50-325-40 Mg Tabs (Butalbital-Apap-Caffeine) .Marland Kitchen.. 1-2 Tabs Q4h As Needed For Migraine Headache, No More Than 6 Tabs Per Day 4)  Fluticasone Propionate 50 Mcg/act Susp (Fluticasone Propionate) .... 2 Sprays Each Nostril Once Daily 5)  Zyrtec Allergy 10 Mg Tabs (Cetirizine Hcl) .Marland Kitchen.. 1 Tab By Mouth Daily For Allergies 6)  Ferrous Sulfate 325 (65 Fe) Mg Tabs (Ferrous Sulfate) .Marland Kitchen.. 1 Tab By Mouth 1-2 Times A Day For Anemia 7)  Zantac 150 Maximum Strength 150 Mg Tabs (Ranitidine  Hcl) .... Take 1 Tab By Mouth Daily 8)  Hydrochlorothiazide 25 Mg Tabs (Hydrochlorothiazide) .... Take 1 Tab By Mouth Daily 9)  Labetalol Hcl 200 Mg Tabs (Labetalol Hcl) .Marland Kitchen.. 1 Tab By Mouth Two Times A Day 10)  Polymyxin B-Trimethoprim 10000-0.1 Unit/ml-% Soln (Polymyxin B-Trimethoprim) .Marland Kitchen.. 1 Drop in Affected Eye 4 Times A Day For 7 Days Dispo: Qs 11)  Keflex 500 Mg Caps (Cephalexin) .Marland Kitchen.. 1 Cap Twice A Day For 7 Days  Allergies: No Known Drug Allergies  Physical Exam  General:  Vitals reviewed, well appearing, no acute distress, obese Extremities:  Bilateral big toes: medial edges are improved and now appear normal.  However both lateral edges still appear swollen.  They are not painful to touch except at the point where the toe nail should emerge.  No purulent material.  The lateral nails are still stuck underneath the skin fold and looks like they were cut back too much.   Impression & Recommendations:  Problem # 1:  INGROWN TOENAIL, INFECTED (ICD-703.0) Assessment Improved  No longer appear infected.  They the lateral edge still looks ingrown.  Advised pt to continue to use warm soaks and try and put a little piece of cotton swab underneath the lateral nail in order to push the nail up and  keep it from staying ingrown.  If not better in 4 weeks would consider lateral nail excision. The following medications were removed from the medication list:    Keflex 500 Mg Caps (Cephalexin) .Marland Kitchen... 1 cap twice a day for 7 days  Orders: [facility]- Est Level  3 (16109)  Complete Medication List: 1)  Acyclovir 400 Mg Tabs (Acyclovir) .Marland Kitchen.. 1 tab by mouth three times a day starting at [redacted] weeks gestation 2)  P D Natal Vitamins/folic Acid Tabs (Prenatal multivit-min-fe-fa) .... Take one tablet by mouth daily 3)  Fioricet 50-325-40 Mg Tabs (Butalbital-apap-caffeine) .Marland Kitchen.. 1-2 tabs q4h as needed for migraine headache, no more than 6 tabs per day 4)  Fluticasone Propionate 50 Mcg/act Susp (Fluticasone  propionate) .... 2 sprays each nostril once daily 5)  Zyrtec Allergy 10 Mg Tabs (Cetirizine hcl) .Marland Kitchen.. 1 tab by mouth daily for allergies 6)  Ferrous Sulfate 325 (65 Fe) Mg Tabs (Ferrous sulfate) .Marland Kitchen.. 1 tab by mouth 1-2 times a day for anemia 7)  Zantac 150 Maximum Strength 150 Mg Tabs (Ranitidine hcl) .... Take 1 tab by mouth daily 8)  Hydrochlorothiazide 25 Mg Tabs (Hydrochlorothiazide) .... Take 1 tab by mouth daily 9)  Labetalol Hcl 200 Mg Tabs (Labetalol hcl) .Marland Kitchen.. 1 tab by mouth two times a day

## 2010-02-19 NOTE — Progress Notes (Signed)
Summary: Rx Req  Phone Note Call from Patient Call back at [phone]   Caller: Patient Summary of Call: Pt says she was to get a rx for Labetalol 200mg  when she was last here.  She is out of town and needs it called into a pharmacy there the pharmacy is [facility] [address] in [place], [place] the number is [phone].  Initial call taken by: Clydell Hakim,  July 25, 2009 9:50 AM  Follow-up for Phone Call        Rx Called In Follow-up by: Angelena Sole MD,  July 25, 2009 1:55 PM    New/Updated Medications: LABETALOL HCL 200 MG TABS (LABETALOL HCL) 1 tab by mouth two times a day

## 2010-02-19 NOTE — Assessment & Plan Note (Signed)
Summary: f/u hosp,df   Vital Signs:  Patient profile:   35 year old female Height:      61.5 inches Weight:      215 pounds BMI:     40.11 BSA:     1.96 Temp:     98.8 degrees F Pulse rate:   81 / minute BP sitting:   119 / 78  Vitals Entered By: Jone Baseman CMA (May 31, 2009 2:53 PM) CC: f/u hospital Is Patient Diabetic? No Pain Assessment Patient in pain? no        Primary Care Provider:  Angelena Sole MD  CC:  f/u hospital.  History of Present Illness: 1. F/U Preeclampsia:  Pt was just discharged from [facility] last Friday after she was seen and treated for preeclampsia.  She went to [facility] after she checked her BP and it was 160/129.  She was given Magnesium while in the hospital and then was started on Labetalol and HCTZ at discharge.  She has been doing well since being discharged.       ROS:  She denies any headache, vision changes, RUQ pain, or LE swelling.  Habits & Providers  Alcohol-Tobacco-Diet     Tobacco Status: never  Current Medications (verified): 1)  Acyclovir 400 Mg Tabs (Acyclovir) .Marland Kitchen.. 1 Tab By Mouth Three Times A Day Starting At [redacted] Weeks Gestation 2)  P D Natal Vitamins/folic Acid  Tabs (Prenatal Multivit-Min-Fe-Fa) .... Take One Tablet By Mouth Daily 3)  Fioricet 50-325-40 Mg Tabs (Butalbital-Apap-Caffeine) .Marland Kitchen.. 1-2 Tabs Q4h As Needed For Migraine Headache, No More Than 6 Tabs Per Day 4)  Fluticasone Propionate 50 Mcg/act Susp (Fluticasone Propionate) .... 2 Sprays Each Nostril Once Daily 5)  Zyrtec Allergy 10 Mg Tabs (Cetirizine Hcl) .Marland Kitchen.. 1 Tab By Mouth Daily For Allergies 6)  Ferrous Sulfate 325 (65 Fe) Mg Tabs (Ferrous Sulfate) .Marland Kitchen.. 1 Tab By Mouth 1-2 Times A Day For Anemia 7)  Zantac 150 Maximum Strength 150 Mg Tabs (Ranitidine Hcl) .... Take 1 Tab By Mouth Daily  Allergies: No Known Drug Allergies  Past History:  Past Medical History: Reviewed history from 03/06/2008 and no changes required. hx abnormal pap,  Steroid injection of R carpal  tunnel 1/05, 5/05  Social History: Reviewed history from 03/16/2009 and no changes required. 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   Eyes:  EOMI, PERRL Lungs:  normal respiratory effort, normal breath sounds, and no wheezes.   Heart:  normal rate, regular rhythm, and soft systolic murmur.   Abdomen:  S / NT / ND.  No RUQ tenderness Extremities:  no LE edema Neurologic:  alert & oriented X3.  Reflexes normal.   Skin:  turgor normal and color normal.     Impression & Recommendations:  Problem # 1:  PRE-ECLAMPSIA (ICD-642.40) Assessment Improved  No signs of symptoms of continued preeclampsia.  She is tolerating her BP medicines so will continue those as prescribed by [facility].    Orders: [facility]- Est Level  3 (02542)  Complete Medication List: 1)  Acyclovir 400 Mg Tabs (Acyclovir) .Marland Kitchen.. 1 tab by mouth three times a day starting at [redacted] weeks gestation 2)  P D Natal Vitamins/folic Acid Tabs (Prenatal multivit-min-fe-fa) .... Take one tablet by mouth daily 3)  Fioricet 50-325-40 Mg Tabs (Butalbital-apap-caffeine) .Marland Kitchen.. 1-2 tabs q4h as needed for migraine headache, no more than  6 tabs per day 4)  Fluticasone Propionate 50 Mcg/act Susp (Fluticasone propionate) .... 2 sprays each nostril once daily 5)  Zyrtec Allergy 10 Mg Tabs (Cetirizine hcl) .Marland Kitchen.. 1 tab by mouth daily for allergies 6)  Ferrous Sulfate 325 (65 Fe) Mg Tabs (Ferrous sulfate) .Marland Kitchen.. 1 tab by mouth 1-2 times a day for anemia 7)  Zantac 150 Maximum Strength 150 Mg Tabs (Ranitidine hcl) .... Take 1 tab by mouth daily  Patient Instructions: 1)  I am glad that you are doing better after being in the hospital. 2)  Your blood pressure is well controlled 3)  We will not make any changes to your blood pressure medications. 4)  Please schedule your 6 week post partum visit

## 2010-02-19 NOTE — Progress Notes (Signed)
Summary: triage  Phone Note Call from Patient Call back at [phone]   Caller: Patient Summary of Call: Pt is pregnant and has a head cold and wondering what she can take over the counter. Initial call taken by: Clydell Hakim,  February 22, 2009 10:05 AM  Follow-up for Phone Call        sick x 2 days . told her the approved meds on the list we have. told her it is probably a virus & will get better in 4-5 days. call back if worse & she wants to be seen. she agreed with plan Follow-up by: Golden Circle RN,  February 22, 2009 10:16 AM

## 2010-02-19 NOTE — Assessment & Plan Note (Signed)
Summary: OB/KH   Vital Signs:  Patient profile:   35 year old female Weight:      236 pounds BP sitting:   128 / 79  Primary Care Provider:  Angelena Sole MD   History of Present Illness: 35 yo G2P0010 @ 37.4 weeks.  Doing well.  One episode of vomiting this am, but not routinely having it.  Feel mildly nauseated now but doing well.  Decline any need for medication.  Denies fever, chills, nausea, vomiting, diarrhea or constipation denies HA, sob, visual changes vaginal discharge or bleeding, +FM, no real contractions.  Pt undecided on birth control.  Was leaning to toward micronor but now is re-evaluating options.  Will make decision at next visit.  Does want epidural.  Hx of herpes- on acyclovir no outbreaks since before pregnacy  Would like to make sure during her labor her sister, who is a Engineer, civil (consulting) in [employer], would not know she is on acyclovir.       Habits & Providers  Alcohol-Tobacco-Diet     Cigarette Packs/Day: n/a  Current Medications (verified): 1)  Acyclovir 400 Mg Tabs (Acyclovir) .Marland Kitchen.. 1 Tab By Mouth Three Times A Day Starting At [redacted] Weeks Gestation 2)  P D Natal Vitamins/folic Acid  Tabs (Prenatal Multivit-Min-Fe-Fa) .... Take One Tablet By Mouth Daily 3)  Fioricet 50-325-40 Mg Tabs (Butalbital-Apap-Caffeine) .Marland Kitchen.. 1-2 Tabs Q4h As Needed For Migraine Headache, No More Than 6 Tabs Per Day 4)  Fluticasone Propionate 50 Mcg/act Susp (Fluticasone Propionate) .... 2 Sprays Each Nostril Once Daily 5)  Zyrtec Allergy 10 Mg Tabs (Cetirizine Hcl) .Marland Kitchen.. 1 Tab By Mouth Daily For Allergies 6)  Ferrous Sulfate 325 (65 Fe) Mg Tabs (Ferrous Sulfate) .Marland Kitchen.. 1 Tab By Mouth 1-2 Times A Day For Anemia 7)  Zantac 150 Maximum Strength 150 Mg Tabs (Ranitidine Hcl) .... Take 1 Tab By Mouth Daily  Allergies (verified): No Known Drug Allergies  Past History:  Past medical, surgical, family and social histories (including risk factors) reviewed, and no changes noted (except as noted  below).  Past Medical History: Reviewed history from 03/06/2008 and no changes required. hx abnormal pap,  Steroid injection of R carpal tunnel 1/05, 5/05  Past Surgical History: Reviewed history from 03/06/2008 and no changes required. 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 Diabetes in mother and father, Hypertension, hypercholesterolemia, CVA  Social History: Reviewed history from 03/16/2009 and no changes required. 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   Mouth:  good dentition and pharynx pink and moist.   Abdomen:  S / NT / ND  Gravid c/w dates (37) FHt 11's Genitalia:  no external herpetic lesions  Extremities:  no edema   Impression & Recommendations:  Problem # 1:  PREGNANCY (ICD-V22.2) 35 yo G2 P0010 @ 37.4 weeks Measuring well, GBS +, hx of herpes no outbreaks on acyclovir.    Orders: Other OB visit- [facility] ([facility])  Complete Medication List: 1)  Acyclovir 400 Mg Tabs (Acyclovir) .Marland Kitchen.. 1 tab by mouth three times a day starting at [redacted] weeks gestation 2)  P D Natal Vitamins/folic Acid Tabs (Prenatal multivit-min-fe-fa) .... Take one tablet by mouth daily 3)  Fioricet 50-325-40 Mg Tabs (Butalbital-apap-caffeine) .Marland Kitchen.. 1-2 tabs q4h as needed for migraine headache, no more than 6 tabs per day 4)  Fluticasone Propionate 50 Mcg/act Susp (Fluticasone propionate) .... 2 sprays each nostril once daily 5)  Zyrtec Allergy 10 Mg Tabs (Cetirizine hcl) .Marland Kitchen.. 1 tab by mouth daily for allergies 6)  Ferrous Sulfate 325 (65 Fe) Mg Tabs (Ferrous sulfate) .Marland Kitchen.. 1 tab by mouth 1-2 times a day for anemia 7)  Zantac 150 Maximum Strength 150 Mg Tabs (Ranitidine  hcl) .... Take 1 tab by mouth daily  Patient Instructions: 1)  Very nice to meet you 2)  You are doing great 3)  Please make an appointment to be seen by your Dr. in 1 week 4)  Please think about the different options for birth control.  5)  If you start to have contractions fairly consistently, such as every 8-10 minutes, please call or go to [facility] hospital.    [facility] for Follow-up Visit    Estimated weeks of       gestation:     37 4/7    Weight:     236    Blood pressure:   128 / 79    Headache:     No    Nausea/vomiting:   vom1/today    Edema:     TrLE    Vaginal bleeding:   no    Vaginal discharge:   no    Fundal height:      37    FHR:       158    Fetal activity:     yes    Labor symptoms:   no    Fetal position:     vertex    Cx Dilation:     0    Cx Effacement:   0%    Cx Station:     high    Taking prenatal vits?   Y    Smoking:     n/a    Next visit:     1 wk    Resident:     Amy Miller    Preceptor:     Amy Miller   Appended Document: OB/KH I have seen Amy Miller and discussed her care with Dr. Katrinka Miller.  She is doing well.  Taking acyclovir without problems.  GBS pos discussed.  F/u with Dr. Lelon Miller.

## 2010-02-19 NOTE — Assessment & Plan Note (Signed)
Summary: ob f/u   Flowsheet View for Follow-up Visit    Estimated weeks of       gestation:     23 3/7    Weight:     220    Blood pressure:   132 / 60    Hx headache?     No    Nausea/vomiting?   No    Edema?     0    Bleeding?     no    Leakage/discharge?   no    Fetal activity:       yes    Labor symptoms?   no    Fundal height:      23    FHR:       155    Taking Vitamins?   Y    Smoking PPD:   na    Next visit:     4 wk    Resident:     [name]    Preceptor:     [name]  Primary Care Provider:  Norton Blizzard MD   History of Present Illness: 1. Possible exposure to MRSA:  Unconfirmed boil on childs buttocks.  She has had a boil under her arm but it was there before she knew about the child.  No direct contact with the child.  2. 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  This was a confirmed case from a doctors note.  Was not working one-on-one with the child.  She has had no symptoms herself.  Denies any malaise, myalgia, arthralgia, fevers, rash.     Physical Examination  Vital Signs:  BP (upright): 132/60  Wt: 220  Last Ht: 61.5 (10/09/2008)  General Exam:  Constitutional:    Well appering, obese Skin:    small boil <1cm x 2cm in right axilla. Cardiovascular:    Normal rate and regular rhythm. S1 and S2 normal without gallop, murmur, click, rub or other extra sounds. Respiratory:    no respiratory distress and clear to auscultation.   Abdomen:    S / NT/ gravid  Impression & Recommendations:  Problem # 1:  PREGNANCY (ICD-V22.2) Assessment Unchanged  Pt with possible exposure to Fifths disease.  No direct contact with the child.  No symptoms.  Discussed with Dr. [name].  Reassure mom.  Routine follow up.  Orders: Other OB visit- [facility] ([facility])  Problem # 2:  ABSCESS, SKIN (ICD-682.9) Assessment: New  Improving.  Has already been draining.  Recommended warm compresses.  No antibiotics or I&D needed at this  point.  Orders: Other OB visit- [facility] ([facility])  Complete Medication List: 1)  Acyclovir 400 Mg Tabs (Acyclovir) .Marland Kitchen.. 1 tab by mouth three times a day starting at [redacted] weeks gestation 2)  P D Natal Vitamins/folic Acid Tabs (Prenatal multivit-min-fe-fa) .... Take one tablet by mouth daily 3)  Fioricet 50-325-40 Mg Tabs (Butalbital-apap-caffeine) .Marland Kitchen.. 1-2 tabs q4h as needed for migraine headache, no more than 6 tabs per day 4)  Fluticasone Propionate 50 Mcg/act Susp (Fluticasone propionate) .... 2 sprays each nostril once daily 5)  Zyrtec Allergy 10 Mg Tabs (Cetirizine hcl) .Marland Kitchen.. 1 tab by mouth daily for allergies 6)  Ferrous Sulfate 325 (65 Fe) Mg Tabs (Ferrous sulfate) .Marland Kitchen.. 1 tab by mouth 1-2 times a day for anemia 7)  Zantac 150 Maximum Strength 150 Mg Tabs (Ranitidine hcl) .... Take 1 tab by mouth daily  Patient Instructions: 1)  I checked with the supervising doctor and  she agrees that you should not be concerned about your possible exposure to fifth disease especially since you didn't have direct contact and didn't have any symptoms yourself 2)  Lets keep an eye on that boil under your arm.  For now just use warm compresses 4 times a day. 3)  Please schedule a follow up appt in 4 weeks.  Flowsheet View for Follow-up Visit    Estimated weeks of       gestation:     23 3/7    Weight:     220    Blood pressure:   132 / 60    Headache:     No    Nausea/vomiting:   No    Edema:     0    Vaginal bleeding:   no    Vaginal discharge:   no    Fundal height:      23    FHR:       155    Fetal activity:     yes    Labor symptoms:   no    Taking prenatal vits?   Y    Smoking:     na    Next visit:     4 wk    Resident:     [name]    Preceptor:     [name]   Vital Signs:  Patient Profile:   35 Years Old Female Height:     61.5 inches Weight:      220 pounds BP sitting:   132 / 60  Vitals Entered By: Jone Baseman CMA (January 31, 2009 10:05 AM)

## 2010-02-19 NOTE — Assessment & Plan Note (Signed)
Summary: cpe,tcb   Vital Signs:  Patient profile:   35 year old female Height:      61.5 inches Weight:      224 pounds BMI:     41.79 BSA:     2.00 Temp:     99.2 degrees F Pulse rate:   85 / minute BP sitting:   128 / 82  Vitals Entered By: Jone Baseman CMA (September 26, 2009 3:54 PM) CC: Pink eye and infected toe Is Patient Diabetic? No Pain Assessment Patient in pain? no        Primary Care Provider:  Angelena Sole MD  CC:  Pink eye and infected toe.  History of Present Illness: 1. Pink eye: - Noticed her right eye feeling irritated yesterday - Woke up this morning with her right eye crusted with purulent material - Her eye has been getting more red since then  ROS: denies fevers, vision changes, eye pain  2. Infected toe? - Pt has a pedicure about 2 months ago and think that they went to deep in the medial edges of both big toes - About 1.5 months she has noticed that both big toes have been irritated - Since then they have gotten worse - Now both medial edges of both big toes are swollen and painful.  There is also some purulent material that can be expressed  ROS: denies toe warmth or spreading redness  Habits & Providers  Alcohol-Tobacco-Diet     Tobacco Status: never     Cigarette Packs/Day: n/a  Current Medications (verified): 1)  Acyclovir 400 Mg Tabs (Acyclovir) .Marland Kitchen.. 1 Tab By Mouth Three Times A Day Starting At [redacted] Weeks Gestation 2)  P D Natal Vitamins/folic Acid  Tabs (Prenatal Multivit-Min-Fe-Fa) .... Take One Tablet By Mouth Daily 3)  Fioricet 50-325-40 Mg Tabs (Butalbital-Apap-Caffeine) .Marland Kitchen.. 1-2 Tabs Q4h As Needed For Migraine Headache, No More Than 6 Tabs Per Day 4)  Fluticasone Propionate 50 Mcg/act Susp (Fluticasone Propionate) .... 2 Sprays Each Nostril Once Daily 5)  Zyrtec Allergy 10 Mg Tabs (Cetirizine Hcl) .Marland Kitchen.. 1 Tab By Mouth Daily For Allergies 6)  Ferrous Sulfate 325 (65 Fe) Mg Tabs (Ferrous Sulfate) .Marland Kitchen.. 1 Tab By Mouth 1-2 Times  A Day For Anemia 7)  Zantac 150 Maximum Strength 150 Mg Tabs (Ranitidine Hcl) .... Take 1 Tab By Mouth Daily 8)  Hydrochlorothiazide 25 Mg Tabs (Hydrochlorothiazide) .... Take 1 Tab By Mouth Daily 9)  Labetalol Hcl 200 Mg Tabs (Labetalol Hcl) .Marland Kitchen.. 1 Tab By Mouth Two Times A Day 10)  Polymyxin B-Trimethoprim 10000-0.1 Unit/ml-% Soln (Polymyxin B-Trimethoprim) .Marland Kitchen.. 1 Drop in Affected Eye 4 Times A Day For 7 Days Dispo: Qs 11)  Keflex 500 Mg Caps (Cephalexin) .Marland Kitchen.. 1 Cap Twice A Day For 7 Days  Allergies: No Known Drug Allergies  Past History:  Past Medical History: Reviewed history from 07/02/2009 and no changes required. HTN hx abnormal pap,  Steroid injection of R carpal tunnel 1/05, 5/05  Social History: Reviewed history from 07/02/2009 and no changes required. 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   Heart:  normal rate and regular rhythm.   Extremities:  Bilateral big toes: both medial edges are  swollen, red, and painful to touch.  Some purulent material able to be expressed.  Looks like both edges are forming in grown toenails.   Impression & Recommendations:  Problem # 1:  CONJUNCTIVITIS, ACUTE, RIGHT (ICD-372.00) Assessment New  Possible bacterial conjunctivitis.  Will treat with antibiotic eye drops. Her updated medication list for this problem includes:    Polymyxin B-trimethoprim 10000-0.1 Unit/ml-% Soln (Polymyxin b-trimethoprim) .Marland Kitchen... 1 drop in affected eye 4 times a day for 7 days dispo: qs  Orders: [facility]- Est  Level 4 (99214)  Problem # 2:  INGROWN TOENAIL, INFECTED (ICD-703.0) Assessment: New  Ingrown toe nails that are infected.  Will try  conservative treatment for now with Keflex and warm soaks.  Advised her to try and free up the medial edges of the toe nails.  If not improved in two weeks may need to remove part of the nail on both big toes. Her updated medication list for this problem includes:    Keflex 500 Mg Caps (Cephalexin) .Marland Kitchen... 1 cap twice a day for 7 days  Orders: [facility]- Est  Level 4 (04540)  Complete Medication List: 1)  Acyclovir 400 Mg Tabs (Acyclovir) .Marland Kitchen.. 1 tab by mouth three times a day starting at [redacted] weeks gestation 2)  P D Natal Vitamins/folic Acid Tabs (Prenatal multivit-min-fe-fa) .... Take one tablet by mouth daily 3)  Fioricet 50-325-40 Mg Tabs (Butalbital-apap-caffeine) .Marland Kitchen.. 1-2 tabs q4h as needed for migraine headache, no more than 6 tabs per day 4)  Fluticasone Propionate 50 Mcg/act Susp (Fluticasone propionate) .... 2 sprays each nostril once daily 5)  Zyrtec Allergy 10 Mg Tabs (Cetirizine hcl) .Marland Kitchen.. 1 tab by mouth daily for allergies 6)  Ferrous Sulfate 325 (65 Fe) Mg Tabs (Ferrous sulfate) .Marland Kitchen.. 1 tab by mouth 1-2 times a day for anemia 7)  Zantac 150 Maximum Strength 150 Mg Tabs (Ranitidine hcl) .... Take 1 tab by mouth daily 8)  Hydrochlorothiazide 25 Mg Tabs (Hydrochlorothiazide) .... Take 1 tab by mouth daily 9)  Labetalol Hcl 200 Mg Tabs (Labetalol hcl) .Marland Kitchen.. 1 tab by mouth two times a day 10)  Polymyxin B-trimethoprim 10000-0.1 Unit/ml-% Soln (Polymyxin b-trimethoprim) .Marland Kitchen.. 1 drop in affected eye 4 times a day for 7 days dispo: qs 11)  Keflex 500 Mg Caps (Cephalexin) .Marland Kitchen.. 1 cap twice a day for 7 days  Patient Instructions: 1)  For your pink eye, I am going to treat it with some antibioitic eye drops 2)  For your toes.  We will try the conservative approach for the next two weeks.  Try the antibiotics and continue using the warm soaks 3)  Please schedule a follow up appointment in 2 weeks to see if we may need to do some toe nail removal Prescriptions: KEFLEX 500 MG CAPS (CEPHALEXIN) 1 cap twice  a day for 7 days  #14 x 0   Entered and Authorized by:   Angelena Sole MD   Signed by:   Angelena Sole MD on 09/26/2009   Method used:   Electronically to        [facility] (retail)       [address]       [place], [place]  [address]       Ph: [phone]       Fax: [phone]   RxID:   [id number] POLYMYXIN B-TRIMETHOPRIM 10000-0.1 UNIT/ML-% SOLN (POLYMYXIN B-TRIMETHOPRIM) 1 drop in affected eye 4 times a day for 7 days  Dispo: QS  #1 x 0   Entered and Authorized by:   Angelena Sole MD   Signed by:   Angelena Sole MD on 09/26/2009   Method used:   Electronically to        [facility] (retail)       [address]       [place], [place]  [address]       Ph: [phone]       Fax: [phone]   RxID:   [id number]   Prevention & Chronic Care Immunizations   Influenza vaccine: given  (01/24/2008)   Influenza vaccine due: 01/23/2009    Tetanus booster: 03/06/2008: given   Tetanus booster due: 03/06/2018    Pneumococcal vaccine: Not documented  Other Screening   Pap smear: NEGATIVE FOR INTRAEPITHELIAL LESIONS OR MALIGNANCY.  (10/09/2008)   Pap smear due: 11/20/2005   Smoking status: never  (09/26/2009)  Lipids   Total Cholesterol: 156  (02/22/2007)   LDL: 91  (02/22/2007)   LDL Direct: Not documented   HDL: 50  (02/22/2007)   Triglycerides: 76  (02/22/2007)

## 2010-02-19 NOTE — Letter (Signed)
Summary: Generic Letter  [facility]  [address]   [place], [place] [address]   Phone: [phone]  Fax: [phone]    11/06/2009  [facility] [address] [place], [place]  [address]  Dear Ms. Kovar,   Your PAP smear was normal.  Please call the office with any questions.    Sincerely,   Angelena Sole MD  Appended Document: Generic Letter mailed

## 2010-02-19 NOTE — Miscellaneous (Signed)
  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

## 2010-02-19 NOTE — Assessment & Plan Note (Signed)
Summary: ob visit/eo   Vital Signs:  Patient profile:   35 year old female Weight:      234 pounds BP sitting:   121 / 74  Primary Care Provider:  Norton Blizzard MD   History of Present Illness: 1. Carpal Tunnel Syndrome:  slightly better with wrist splints, acupuncture, and Tiger Balm  2. Herpes:  No flares since pregnancy.  Would like to make sure that family doesn't know about her Herpes.  Habits & Providers  Alcohol-Tobacco-Diet     Cigarette Packs/Day: n/a  Allergies: No Known Drug Allergies  Social History: Reviewed history from 03/16/2009 and no changes required. 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   Heart:  normal rate, regular rhythm, and no murmur.   Abdomen:  S / NT / ND  Gravid c/w dates Extremities:  trace lower extremity edema   Impression & Recommendations:  Problem # 1:  PREGNANCY (ICD-V22.2) Assessment Unchanged  Doing well.  Measuring a little larger than dates today.  Follow up in OB clinic next visit.  Orders: Other OB visit- [facility] ([facility])  Problem # 2:  GENITAL HERPES (ICD-054.10) Assessment: Unchanged No outbreaks.  Start Acyclovir at 36 weeks.  Complete Medication List: 1)  Acyclovir 400 Mg Tabs (Acyclovir) .Marland Kitchen.. 1 tab by mouth three times a day starting at [redacted] weeks gestation 2)  P D Natal Vitamins/folic Acid Tabs (Prenatal multivit-min-fe-fa) .... Take one tablet by mouth daily 3)  Fioricet 50-325-40 Mg Tabs (Butalbital-apap-caffeine) .Marland Kitchen.. 1-2 tabs q4h as needed for migraine headache, no more than 6 tabs per day 4)  Fluticasone Propionate 50 Mcg/act Susp (Fluticasone propionate) .... 2 sprays each nostril once daily 5)  Zyrtec Allergy 10 Mg Tabs (Cetirizine hcl) .Marland Kitchen.. 1 tab by mouth daily for allergies 6)  Ferrous Sulfate 325 (65 Fe) Mg Tabs  (Ferrous sulfate) .Marland Kitchen.. 1 tab by mouth 1-2 times a day for anemia 7)  Zantac 150 Maximum Strength 150 Mg Tabs (Ranitidine hcl) .... Take 1 tab by mouth daily  Patient Instructions: 1)  You are doing great, we are getting there 2)  It is okay for you to continue the acupuncture and the tiger balm 3)  Start taking the Acyclovir at 36 weeks 4)  Please schedule a follow up appointment in 2 weeks with the [facility] clinic (Dr. Mauricio Po or Dr. [name])    Flowsheet View for Follow-up Visit    Estimated weeks of       gestation:     34 2/7    Weight:     234    Blood pressure:   121 / 74    Headache:     No    Nausea/vomiting:   nausea    Edema:     TrLE    Vaginal bleeding:   no    Vaginal discharge:   no    Fundal height:      35    FHR:       150    Fetal activity:     yes    Labor symptoms:   no    Taking prenatal vits?   Y    Smoking:     n/a    Next visit:     2 wk    Resident:     [name]  Preceptor:     Deirdre Priest

## 2010-02-19 NOTE — Progress Notes (Signed)
Summary: phn msg  Phone Note Call from Patient Call back at Home Phone [phone]   Caller: Patient Summary of Call: pt is running out of her prenatal vitamins and is not sure if she should refill or get OTC vitamins [facility] Initial call taken by: De Nurse,  November 23, 2009 2:51 PM  Follow-up for Phone Call        If she is still breast feeding or plans on trying to have another baby then continue the prenatals otherwise just OTC vitamins are fine. Follow-up by: Angelena Sole MD,  November 28, 2009 11:15 AM    Prescriptions: P D NATAL VITAMINS/FOLIC ACID  TABS (PRENATAL MULTIVIT-MIN-FE-FA) take one tablet by mouth daily  #30 Tablet x 5   Entered and Authorized by:   Angelena Sole MD   Signed by:   Angelena Sole MD on 11/28/2009   Method used:   Electronically to        [facility] (retail)       [address]       [place], [place]  [address]       Ph: [phone]       Fax: [phone]   RxID:   [id number]

## 2010-02-19 NOTE — Assessment & Plan Note (Signed)
Summary: OB   Vital Signs:  Patient profile:   35 year old female Weight:      237 pounds BP sitting:   142 / 80  Vitals Entered By: Jone Baseman CMA (May 02, 2009 9:46 AM)  Serial Vital Signs/Assessments:  Time      Position  BP       Pulse  Resp  Temp     By                     124/80                         Angelena Sole MD   Primary Care Provider:  Norton Blizzard MD   History of Present Illness: Elevated blood pressure:  Denies headache, vision changes, RUQ pain  Perineal tissue:  Has noticed extra piece of tissue on her perineum.  It is not painful.  Habits & Providers  Alcohol-Tobacco-Diet     Cigarette Packs/Day: n/a  Current Medications (verified): 1)  Acyclovir 400 Mg Tabs (Acyclovir) .Marland Kitchen.. 1 Tab By Mouth Three Times A Day Starting At [redacted] Weeks Gestation 2)  P D Natal Vitamins/folic Acid  Tabs (Prenatal Multivit-Min-Fe-Fa) .... Take One Tablet By Mouth Daily 3)  Fioricet 50-325-40 Mg Tabs (Butalbital-Apap-Caffeine) .Marland Kitchen.. 1-2 Tabs Q4h As Needed For Migraine Headache, No More Than 6 Tabs Per Day 4)  Fluticasone Propionate 50 Mcg/act Susp (Fluticasone Propionate) .... 2 Sprays Each Nostril Once Daily 5)  Zyrtec Allergy 10 Mg Tabs (Cetirizine Hcl) .Marland Kitchen.. 1 Tab By Mouth Daily For Allergies 6)  Ferrous Sulfate 325 (65 Fe) Mg Tabs (Ferrous Sulfate) .Marland Kitchen.. 1 Tab By Mouth 1-2 Times A Day For Anemia 7)  Zantac 150 Maximum Strength 150 Mg Tabs (Ranitidine Hcl) .... Take 1 Tab By Mouth Daily  Allergies: No Known Drug Allergies  Past History:  Past Medical History: Reviewed history from 03/06/2008 and no changes required. hx abnormal pap,  Steroid injection of R carpal tunnel 1/05, 5/05  Social History: Reviewed history from 03/16/2009 and no changes required. 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   Heart:  normal rate, regular rhythm, and soft systolic murmur.   Abdomen:  S / NT / ND  Gravid c/w dates Genitalia:  Pelvic Exam:        External: small vaginal varice on the perineum        Vagina: normal without lesions or masses        Cervix: normal without lesions or masses, closed cervix        Adnexa: normal bimanual exam without masses or fullness         Extremities:  trace lower extremity edema   Impression & Recommendations:  Problem # 1:  PREGNANCY (ICD-V22.2) Assessment Unchanged Initally elevated blood pressure but improved on recheck.  Neg protein in urine. 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  Sitz bath if become irritated.  Orders: Other OB visit- [facility] ([facility]BCK)  Complete Medication List: 1)  Acyclovir 400 Mg Tabs (Acyclovir) .Marland Kitchen.. 1 tab by mouth three times a day starting at [redacted] weeks gestation 2)  P D Natal Vitamins/folic Acid Tabs (Prenatal multivit-min-fe-fa) .Marland KitchenMarland KitchenMarland Kitchen  Take one tablet by mouth daily 3)  Fioricet 50-325-40 Mg Tabs (Butalbital-apap-caffeine) .Marland Kitchen.. 1-2 tabs q4h as needed for migraine headache, no more than 6 tabs per day 4)  Fluticasone Propionate 50 Mcg/act Susp (Fluticasone propionate) .... 2 sprays each nostril once daily 5)  Zyrtec Allergy 10 Mg Tabs (Cetirizine hcl) .Marland Kitchen.. 1 tab by mouth daily for allergies 6)  Ferrous Sulfate 325 (65 Fe) Mg Tabs (Ferrous sulfate) .Marland Kitchen.. 1 tab by mouth 1-2 times a day for anemia 7)  Zantac 150 Maximum Strength 150 Mg Tabs (Ranitidine hcl) .... Take 1 tab by mouth daily  Patient Instructions: 1)  You are doing great 2)  Please schedule a follow up appointment with Dr. Alvester Morin in 1 week   OB Initial Intake Information    Positive HCG by: self    Race: Black    Marital status: Single    Occupation: outside work    Type of  work: Magazine features editor, [employer] (last grade completed): Masters Degree    Number of children at home: 0    [facility]ospital [facility] delivery: [facility]    Newborn's physician: [facility]  FOB Information    Husband/Father of baby: Yvette Rack    FOB occupation Dentist, stationed in [place]    Phone: [phone]    FOB Comments: FOB is fiance of patient, and is actively involved with pregnancy, though he is currently stationed in [place] with the [employer] as a Social worker.  Menstrual History    LMP (date): 08/20/2008    LMP - Character: normal    Menarche: 8 years    Menses interval: 28 days    Menstrual flow 4-5 days    On BCP's at conception: no    Date of positive (+) home preg. test: 09/26/2008   Flowsheet View for Follow-up Visit    Estimated weeks of       gestation:     36 3/7    Weight:     237    Blood pressure:   142 / 80    Urine Protein:     negative    Urine Glucose:   negative    Headache:     No    Nausea/vomiting:   No    Edema:     TrLE    Vaginal bleeding:   no    Vaginal discharge:   no    Fundal height:      36    FHR:       150    Fetal activity:     yes    Labor symptoms:   no    Taking prenatal vits?   Y    Smoking:     n/a    Next visit:     1 wk    Resident:     [name]    Preceptor:     [name]  Laboratory Results   Urine Tests  Date/Time Received: May 02, 2009 9:51 AM  Date/Time Reported: May 02, 2009 9:55 AM   Routine Urinalysis   Glucose: negative   (Normal Range: Negative) Protein: negative   (Normal Range: Negative)    Comments: ...........test performed by...........Marland KitchenTerese Door, CMA

## 2010-02-19 NOTE — Assessment & Plan Note (Signed)
Summary: 6 wk post partum/eo   Vital Signs:  Patient profile:   35 year old female Height:      61.5 inches Weight:      216.3 pounds BMI:     40.35 Temp:     98.5 degrees F oral Pulse rate:   76 / minute BP sitting:   109 / 71  (left arm) Cuff size:   large  Vitals Entered By: Garen Grams LPN (July 02, 2009 2:34 PM) CC: postpartum check Is Patient Diabetic? No Pain Assessment Patient in pain? no        Primary Care Provider:  Angelena Sole MD  CC:  postpartum check.  History of Present Illness: Postpartum check  Concerns: -Decreased sensation on dorsal surface on top of right foot.  Been there since her hospitalization a couple of weeks ago from preeclampsia.  She doesn't remember hitting it or injuring it.  Has normal ROM and strength.  Bleeding: none Vaginal discharge:  scant Abdominal pain: none Headaches: none Swelling: none  Mood:  Good.  Has some depressed moods at time but overall in good spirits.  Birth control:  Depo  HTN: She is taking and tolerating her medicines as prescribed.  She does not check her blood pressure at home regularly.       ROS: denies chest pain, shortness of breath  Habits & Providers  Alcohol-Tobacco-Diet     Tobacco Status: never  Current Medications (verified): 1)  Acyclovir 400 Mg Tabs (Acyclovir) .Marland Kitchen.. 1 Tab By Mouth Three Times A Day Starting At [redacted] Weeks Gestation 2)  P D Natal Vitamins/folic Acid  Tabs (Prenatal Multivit-Min-Fe-Fa) .... Take One Tablet By Mouth Daily 3)  Fioricet 50-325-40 Mg Tabs (Butalbital-Apap-Caffeine) .Marland Kitchen.. 1-2 Tabs Q4h As Needed For Migraine Headache, No More Than 6 Tabs Per Day 4)  Fluticasone Propionate 50 Mcg/act Susp (Fluticasone Propionate) .... 2 Sprays Each Nostril Once Daily 5)  Zyrtec Allergy 10 Mg Tabs (Cetirizine Hcl) .Marland Kitchen.. 1 Tab By Mouth Daily For Allergies 6)  Ferrous Sulfate 325 (65 Fe) Mg Tabs (Ferrous Sulfate) .Marland Kitchen.. 1 Tab By Mouth 1-2 Times A Day For Anemia 7)  Zantac 150 Maximum  Strength 150 Mg Tabs (Ranitidine Hcl) .... Take 1 Tab By Mouth Daily 8)  Hydrochlorothiazide 25 Mg Tabs (Hydrochlorothiazide) .... Take 1 Tab By Mouth Daily  Allergies: No Known Drug Allergies  Past History:  Past Medical History: HTN hx abnormal pap,  Steroid injection of R carpal tunnel 1/05, 5/05  Social History: Reviewed history from 03/16/2009 and no changes required. 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   Neck:  supple and no masses.   Lungs:  normal respiratory effort, normal breath sounds, and no wheezes.   Heart:  normal rate, regular rhythm, and soft systolic murmur.   Abdomen:  S / NT / ND.  +BS Pulses:  2+ DP pulses bilaterally Extremities:  no LE edema Neurologic:  slightly decreased sensation on the dorsum of right foot.  She is able to feel light touch and pain.  Normal toe sensation.  Normal strength.  No redness, warmth, or skin lesions. Psych:  not anxious appearing and not depressed appearing.     Impression & Recommendations:  Problem # 1:  POSTPARTUM EXAMINATION (ICD-V24.2) Assessment New  Doing well.  No  signs of bleeding.  Not depressed.  [id number]  Complete Medication List: 1)  Acyclovir 400 Mg Tabs (Acyclovir) .Marland Kitchen.. 1 tab by mouth three times a day starting at [redacted] weeks gestation 2)  P D Natal Vitamins/folic Acid Tabs (Prenatal multivit-min-fe-fa) .... Take one tablet by mouth daily 3)  Fioricet 50-325-40 Mg Tabs (Butalbital-apap-caffeine) .Marland Kitchen.. 1-2 tabs q4h as needed for migraine headache, no more than 6 tabs per day 4)  Fluticasone Propionate 50 Mcg/act Susp (Fluticasone propionate) .... 2 sprays each nostril once daily 5)  Zyrtec Allergy 10 Mg Tabs (Cetirizine hcl) .Marland Kitchen.. 1 tab by mouth daily for allergies 6)   Ferrous Sulfate 325 (65 Fe) Mg Tabs (Ferrous sulfate) .Marland Kitchen.. 1 tab by mouth 1-2 times a day for anemia 7)  Zantac 150 Maximum Strength 150 Mg Tabs (Ranitidine hcl) .... Take 1 tab by mouth daily 8)  Hydrochlorothiazide 25 Mg Tabs (Hydrochlorothiazide) .... Take 1 tab by mouth daily  Patient Instructions: 1)  Please schedule a follow up appointment for your next PAP smear

## 2010-02-19 NOTE — Progress Notes (Signed)
Summary: Rx Req  Phone Note Refill Request Call back at [phone] OR [phone] Message from:  Patient  Refills Requested: Medication #1:  HYDROCHLOROTHIAZIDE 25 MG TABS Take 1 tab by mouth daily  Medication #2:  LABETALOL HCL 200 MG TABS 1 tab by mouth two times a day. PT USES [facility].  Initial call taken by: Clydell Hakim,  October 15, 2009 8:58 AM    Prescriptions: LABETALOL HCL 200 MG TABS (LABETALOL HCL) 1 tab by mouth two times a day  #60 x 3   Entered and Authorized by:   Angelena Sole MD   Signed by:   Angelena Sole MD on 10/15/2009   Method used:   Electronically to        [facility] (retail)       [address]       [place], [place]  [address]       Ph: [phone]       Fax: [phone]   RxID:   [id number] HYDROCHLOROTHIAZIDE 25 MG TABS (HYDROCHLOROTHIAZIDE) Take 1 tab by mouth daily  #30 x 3   Entered and Authorized by:   Angelena Sole MD   Signed by:   Angelena Sole MD on 10/15/2009   Method used:   Electronically to        [facility] (retail)       [address]       [place], [place]  [address]       Ph: [phone]       Fax: [phone]   RxID:   [id number]

## 2010-02-19 NOTE — Assessment & Plan Note (Signed)
Summary: ob visit/[facility] pt/eo   Vital Signs:  Patient profile:   35 year old female Weight:      230 pounds BP sitting:   125 / 81  Primary Care Provider:  Norton Blizzard MD   History of Present Illness: 1. Carpal Tunnel Syndrome:   Still a problem.  Using wrist braces to use during work and at night.  These seem to help a little bit.  She is also taking Tylenol but only as needed.  wants to know if she can use accupuncture for treatment.    2. Pregnancy:   -Epidural -Breast feed -Micronor (Doesn't want IUD or Depo) -Wants baby to [facility] with [facility]  Habits & Providers  Alcohol-Tobacco-Diet     Cigarette Packs/Day: n/a  Current Medications (verified): 1)  Acyclovir 400 Mg Tabs (Acyclovir) .Marland Kitchen.. 1 Tab By Mouth Three Times A Day Starting At [redacted] Weeks Gestation 2)  P D Natal Vitamins/folic Acid  Tabs (Prenatal Multivit-Min-Fe-Fa) .... Take One Tablet By Mouth Daily 3)  Fioricet 50-325-40 Mg Tabs (Butalbital-Apap-Caffeine) .Marland Kitchen.. 1-2 Tabs Q4h As Needed For Migraine Headache, No More Than 6 Tabs Per Day 4)  Fluticasone Propionate 50 Mcg/act Susp (Fluticasone Propionate) .... 2 Sprays Each Nostril Once Daily 5)  Zyrtec Allergy 10 Mg Tabs (Cetirizine Hcl) .Marland Kitchen.. 1 Tab By Mouth Daily For Allergies 6)  Ferrous Sulfate 325 (65 Fe) Mg Tabs (Ferrous Sulfate) .Marland Kitchen.. 1 Tab By Mouth 1-2 Times A Day For Anemia 7)  Zantac 150 Maximum Strength 150 Mg Tabs (Ranitidine Hcl) .... Take 1 Tab By Mouth Daily  Allergies (verified): No Known Drug Allergies  Review of Systems  The patient denies weight loss, decreased hearing, chest pain, headaches, abdominal pain, and severe indigestion/heartburn.     Impression & Recommendations:  Problem # 1:  PREGNANCY (ICD-V22.2) Assessment Unchanged pt is a 35yo G2P0010 at 32 weeks.  will need to be seen by OB clinic in this trimester.  no changes to care or concerns today. Orders: Other OB visit- [facility] ([facility]BCK)  Problem # 2:  CARPAL TUNNEL SYNDROME  (ICD-354.0) Assessment: Unchanged Pt would like to see accupuncturist for pain.  Looked this up and no contraindication.  Accuptuncture theory is that it is possible to induce labor by hitting certain nerves.  Pt aware of theoretical risk and will discuss with accupuncture provider.   Orders: Other OB visit- [facility] ([facility]BCK)  Complete Medication List: 1)  Acyclovir 400 Mg Tabs (Acyclovir) .Marland Kitchen.. 1 tab by mouth three times a day starting at [redacted] weeks gestation 2)  P D Natal Vitamins/folic Acid Tabs (Prenatal multivit-min-fe-fa) .... Take one tablet by mouth daily 3)  Fioricet 50-325-40 Mg Tabs (Butalbital-apap-caffeine) .Marland Kitchen.. 1-2 tabs q4h as needed for migraine headache, no more than 6 tabs per day 4)  Fluticasone Propionate 50 Mcg/act Susp (Fluticasone propionate) .... 2 sprays each nostril once daily 5)  Zyrtec Allergy 10 Mg Tabs (Cetirizine hcl) .Marland Kitchen.. 1 tab by mouth daily for allergies 6)  Ferrous Sulfate 325 (65 Fe) Mg Tabs (Ferrous sulfate) .Marland Kitchen.. 1 tab by mouth 1-2 times a day for anemia 7)  Zantac 150 Maximum Strength 150 Mg Tabs (Ranitidine hcl) .... Take 1 tab by mouth daily  Patient Instructions: 1)  It was nice to meet you today! 2)  Please come back in 2 weeks 3)  accupuncture is ok with [place] if it is ok with your provider. 4)  If you have vaginal bleeding, contractions that are consistent, or any other concerns, please go to  the MAU or the Clinic.  Every morning and every evening evaluate whether you think the baby is moving enough.  If not, then do kick counts as follows.  Sit in a quiet place and count each movement.  If you get to 5 baby movements in an hour you can stop.  If you have less than 5 movements, keep counting for another hour.  If you don't get 10 movements in 2 hours, you should go to the MAU or call the clinic    Flowsheet View for Follow-up Visit    Estimated weeks of       gestation:     32 2/7    Weight:     230    Blood pressure:   125 / 81    Headache:     No     Nausea/vomiting:   No    Edema:     1+LE    Vaginal bleeding:   no    Vaginal discharge:   no    Fundal height:      32    FHR:       156    Fetal activity:     yes    Labor symptoms:   no    Taking prenatal vits?   Y    Smoking:     n/a    Next visit:     2 wk    Resident:     Elayah Klooster    Preceptor:     CHambliss

## 2010-02-19 NOTE — Progress Notes (Signed)
Summary: triage  Phone Note Call from Patient Call back at [phone]   Caller: Patient Summary of Call: Pt wondering if she is losing mocus plug and has a little pain in back. Initial call taken by: Clydell Hakim,  May 14, 2009 4:29 PM  Follow-up for Phone Call        she is seeing thick strands of mucous when she wipes after voiding. No ROM. having low back pain that corresponds to labor pains. very irregular. 38 weeks. [facility] if contractions are 5 minutes apart or if her water breaks,. may take tylenol. she has an appt here wed. she has decided she will wait for that appt. told her to call back any time. if pain becomes severe, go to [facility]. she agreed with plan Follow-up by: Golden Circle RN,  May 14, 2009 4:31 PM

## 2010-02-21 NOTE — Progress Notes (Signed)
Summary: Question  Phone Note Call from Patient   Caller: Patient Call For: [phone] Summary of Call: Want some info about toenail removal procedure on 12/29.  Would like to discuss ability to go back to work or not Initial call taken by: Abundio Miu,  December 31, 2009 9:28 AM  Follow-up for Phone Call        discussed with patient and consulted with Dr. Nedra Hai, preceptor. he advises toenail will grow back but takes a while,  probably a month. she will be able to go back to work the next day as long as she can stay off her feet as much as possible . she is a school prinicapal. Follow-up by: Theresia Lo RN,  December 31, 2009 10:06 AM

## 2010-02-21 NOTE — Assessment & Plan Note (Signed)
Summary: infected toe & depo/eo   Vital Signs:  Patient profile:   35 year old female Weight:      223 pounds Temp:     99.7 degrees F oral Pulse rate:   79 / minute BP sitting:   104 / 73  (left arm) Cuff size:   large  Vitals Entered By: Loralee Pacas CMA (January 17, 2010 2:04 PM) CC: right great toe infected Comments right great toe infected but looks to be healing now according to pt. unsure if you want to proceed with avulsion   Primary Care Provider:  Angelena Sole MD  CC:  right great toe infected.  History of Present Illness: 1. Right big toe ingrown nail: - Pt has had this problem since July.  It has been getting better off and on.  It was much worse about 2 weeks ago but since she has been on break and has been off of her feet it is much better.  She is still having some swelling at the lateral nail plate.  She was having some swelling at the medial edge as well but now it is much better.  Today she rates that she has no pain.  She wants to have the toe nail removed because she is tired of dealing with it for this long.   Procedure note: Pt was consented prior to the procedure.  Right toe was cleaned with betadine and alcohol swabs.  The toe was numbed with Lidocain without Epi in normal fashion.  Toe was then sterilized again with betadine and alcohol swabs and the rest of the procedure was performed in normal sterile fashion.  First the lateral 1/4 nail was removed with toe nail scissors.  Seconde the medial 1/4 of the nail was removed with toe nail scissors.  Phenol was introduced into both nail beds.  Pt tolerated the procedue well.  Minimal blood loss.  Patient given information on post-procedure care.  Patient had no questions or concerns.  Current Medications (verified): 1)  Acyclovir 400 Mg Tabs (Acyclovir) .Marland Kitchen.. 1 Tab By Mouth Three Times A Day Starting At [redacted] Weeks Gestation 2)  P D Natal Vitamins/folic Acid  Tabs (Prenatal Multivit-Min-Fe-Fa) .... Take One Tablet  By Mouth Daily 3)  Fioricet 50-325-40 Mg Tabs (Butalbital-Apap-Caffeine) .Marland Kitchen.. 1-2 Tabs Q4h As Needed For Migraine Headache, No More Than 6 Tabs Per Day 4)  Fluticasone Propionate 50 Mcg/act Susp (Fluticasone Propionate) .... 2 Sprays Each Nostril Once Daily 5)  Zyrtec Allergy 10 Mg Tabs (Cetirizine Hcl) .Marland Kitchen.. 1 Tab By Mouth Daily For Allergies 6)  Ferrous Sulfate 325 (65 Fe) Mg Tabs (Ferrous Sulfate) .Marland Kitchen.. 1 Tab By Mouth 1-2 Times A Day For Anemia 7)  Zantac 150 Maximum Strength 150 Mg Tabs (Ranitidine Hcl) .... Take 1 Tab By Mouth Daily 8)  Hydrochlorothiazide 25 Mg Tabs (Hydrochlorothiazide) .... Take 1 Tab By Mouth Daily 9)  Labetalol Hcl 200 Mg Tabs (Labetalol Hcl) .Marland Kitchen.. 1 Tab By Mouth Two Times A Day  Allergies: No Known Drug Allergies  Past History:  Past Medical History: Reviewed history from 07/02/2009 and no changes required. HTN hx abnormal pap,  Steroid injection of R carpal tunnel 1/05, 5/05  Social History: Reviewed history from 10/31/2009 and no changes required. 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  obese. no acute distress Lungs:  normal respiratory effort.   Heart:  normal rate and regular  rhythm.   Soft systolic murmur Extremities:  right big toe: lateral ingrown toe nail.  Some swelling in the area.  Minimal pain with palpation.  Pain elicited with lateral traction of the skin fold.   Impression & Recommendations:  Problem # 1:  INGROWN TOENAIL, INFECTED (ICD-703.0) Assessment Deteriorated  Not improved with conservative management.  Removed the lateral and medial nails without complications.  F/U in 1 week to recheck.  Orders: [facility]- Est Level  3 ([address])  Complete Medication List: 1)  Acyclovir 400 Mg Tabs (Acyclovir) .Marland Kitchen.. 1 tab by mouth three times a day starting at [redacted] weeks gestation 2)  P D Natal Vitamins/folic Acid Tabs (Prenatal  multivit-min-fe-fa) .... Take one tablet by mouth daily 3)  Fioricet 50-325-40 Mg Tabs (Butalbital-apap-caffeine) .Marland Kitchen.. 1-2 tabs q4h as needed for migraine headache, no more than 6 tabs per day 4)  Fluticasone Propionate 50 Mcg/act Susp (Fluticasone propionate) .... 2 sprays each nostril once daily 5)  Zyrtec Allergy 10 Mg Tabs (Cetirizine hcl) .Marland Kitchen.. 1 tab by mouth daily for allergies 6)  Ferrous Sulfate 325 (65 Fe) Mg Tabs (Ferrous sulfate) .Marland Kitchen.. 1 tab by mouth 1-2 times a day for anemia 7)  Zantac 150 Maximum Strength 150 Mg Tabs (Ranitidine hcl) .... Take 1 tab by mouth daily 8)  Hydrochlorothiazide 25 Mg Tabs (Hydrochlorothiazide) .... Take 1 tab by mouth daily 9)  Labetalol Hcl 200 Mg Tabs (Labetalol hcl) .Marland Kitchen.. 1 tab by mouth two times a day  Other Orders: Depo-Provera 150mg  (U0454)  Patient Instructions: 1)  Keep the toe nail clean for the next couple of days 2)  Change the dressing in 24 hours 3)  Wash with soap and water 3 times a day 4)  Use Neosporin 3 times a day 5)  Please schedule a follow up appointment in 1 week to check on your toenail.   Medication Administration  Injection # 1:    Medication: Depo-Provera 150mg     Diagnosis: CONTRACEPTIVE MANAGEMENT (ICD-V25.09)    Route: IM    Site: R deltoid    Exp Date: 06/19/2012    Lot #: [id number]    Mfr: greenstone    Comments: pt to rtc 03.16 thru 03.31.2012    Patient tolerated injection without complications    Given by: Loralee Pacas CMA (January 17, 2010 2:12 PM)  Orders Added: 1)  Depo-Provera 150mg  [J1055] 2)  [facility]- Est Level  3 [99213]

## 2010-02-21 NOTE — Assessment & Plan Note (Signed)
Summary: recheck toenail per Dr. Rozetta Nunnery   Vital Signs:  Patient profile:   35 year old female Height:      61.5 inches Weight:      223.4 pounds BMI:     41.68 Temp:     99.0 degrees F oral Pulse rate:   85 / minute BP sitting:   131 / 78  (left arm) Cuff size:   regular  Vitals Entered By: Garen Grams LPN (January 31, 2010 1:36 PM) CC: f/u infected toe Is Patient Diabetic? No Pain Assessment Patient in pain? no        Primary Care Provider:  Angelena Sole MD  CC:  f/u infected toe.  History of Present Illness: 1. Ingrown toenail s/p bilateral edge excision:  Her toe feels pretty good.  She had very little bleeding after the procedure.  She has been using antibiotic cream, keeping it wrapped, and has been using sandals.  There has been no discharge.  She denies any pain.  ROS: denies fevers, redness  2. Sinusitis:  She has been fighting a sinus infection for about 2 weeks.  She has had sinus pain, pressure as well as runny nose and purulent nasal discharge.  In the past couple of days it has been getting better.  ROS: endorses headaches  Habits & Providers  Alcohol-Tobacco-Diet     Tobacco Status: never     Cigarette Packs/Day: n/a  Current Medications (verified): 1)  Acyclovir 400 Mg Tabs (Acyclovir) .Marland Kitchen.. 1 Tab By Mouth Three Times A Day Starting At [redacted] Weeks Gestation 2)  P D Natal Vitamins/folic Acid  Tabs (Prenatal Multivit-Min-Fe-Fa) .... Take One Tablet By Mouth Daily 3)  Fioricet 50-325-40 Mg Tabs (Butalbital-Apap-Caffeine) .Marland Kitchen.. 1-2 Tabs Q4h As Needed For Migraine Headache, No More Than 6 Tabs Per Day 4)  Fluticasone Propionate 50 Mcg/act Susp (Fluticasone Propionate) .... 2 Sprays Each Nostril Once Daily 5)  Zyrtec Allergy 10 Mg Tabs (Cetirizine Hcl) .Marland Kitchen.. 1 Tab By Mouth Daily For Allergies 6)  Ferrous Sulfate 325 (65 Fe) Mg Tabs (Ferrous Sulfate) .Marland Kitchen.. 1 Tab By Mouth 1-2 Times A Day For Anemia 7)  Zantac 150 Maximum Strength 150 Mg Tabs (Ranitidine Hcl)  .... Take 1 Tab By Mouth Daily 8)  Hydrochlorothiazide 25 Mg Tabs (Hydrochlorothiazide) .... Take 1 Tab By Mouth Daily 9)  Labetalol Hcl 200 Mg Tabs (Labetalol Hcl) .Marland Kitchen.. 1 Tab By Mouth Two Times A Day 10)  Amoxicillin 500 Mg Caps (Amoxicillin) .Marland Kitchen.. 1 Cap By Mouth Twice A Day For 7 Days  Allergies: No Known Drug Allergies  Past History:  Past Medical History: Reviewed history from 07/02/2009 and no changes required. HTN hx abnormal pap,  Steroid injection of R carpal tunnel 1/05, 5/05  Social History: Reviewed history from 10/31/2009 and no changes required. 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  obese. no acute distress Eyes:  normal conjunctiva Ears:  R ear normal and L ear normal.   Nose:  maxillary sinus tenderness Mouth:  OP pink and moist Neck:  supple, full ROM, and no masses.   Lungs:  normal respiratory effort.   Heart:  normal rate and regular rhythm.   Soft systolic murmur Extremities:  right big toenail: healing appropriately after procedure.  No redness, swelling, or pain.   Impression & Recommendations:  Problem # 1:  INGROWN TOENAIL, INFECTED (ICD-703.0) Assessment Improved  Doing much better since the  procedure.  Advised her that she can stop wrapping the toe but to continue using antibiotic cream for the next couple of weeks. Her updated medication list for this problem includes:    Amoxicillin 500 Mg Caps (Amoxicillin) .Marland Kitchen... 1 cap by mouth twice a day for 7 days  Orders: [facility]- Est  Level 4 (04540)  Problem # 2:  ACUTE MAXILLARY SINUSITIS (ICD-461.0) Assessment: New  Pt with a hx of sinusitis requiring antibiotics to get better.  Advised her to wait a couple of days and then take the antibiotics if not better.  Pt was in agreement with this plan. Her updated medication list for this problem includes:    Fluticasone Propionate 50 Mcg/act Susp  (Fluticasone propionate) .Marland Kitchen... 2 sprays each nostril once daily    Amoxicillin 500 Mg Caps (Amoxicillin) .Marland Kitchen... 1 cap by mouth twice a day for 7 days  Orders: [facility]- Est  Level 4 (98119)  Complete Medication List: 1)  Acyclovir 400 Mg Tabs (Acyclovir) .Marland Kitchen.. 1 tab by mouth three times a day starting at [redacted] weeks gestation 2)  P D Natal Vitamins/folic Acid Tabs (Prenatal multivit-min-fe-fa) .... Take one tablet by mouth daily 3)  Fioricet 50-325-40 Mg Tabs (Butalbital-apap-caffeine) .Marland Kitchen.. 1-2 tabs q4h as needed for migraine headache, no more than 6 tabs per day 4)  Fluticasone Propionate 50 Mcg/act Susp (Fluticasone propionate) .... 2 sprays each nostril once daily 5)  Zyrtec Allergy 10 Mg Tabs (Cetirizine hcl) .Marland Kitchen.. 1 tab by mouth daily for allergies 6)  Ferrous Sulfate 325 (65 Fe) Mg Tabs (Ferrous sulfate) .Marland Kitchen.. 1 tab by mouth 1-2 times a day for anemia 7)  Zantac 150 Maximum Strength 150 Mg Tabs (Ranitidine hcl) .... Take 1 tab by mouth daily 8)  Hydrochlorothiazide 25 Mg Tabs (Hydrochlorothiazide) .... Take 1 tab by mouth daily 9)  Labetalol Hcl 200 Mg Tabs (Labetalol hcl) .Marland Kitchen.. 1 tab by mouth two times a day 10)  Amoxicillin 500 Mg Caps (Amoxicillin) .Marland Kitchen.. 1 cap by mouth twice a day for 7 days  Patient Instructions: 1)  Your toenail looks great 2)  Continue to use the antibiotic cream for the next couple of weeks 3)  Hold off on the antibiotics for a couple of days 4)  If you don't continue to improve, I have sent in a prescription for Amoxicillin Prescriptions: AMOXICILLIN 500 MG CAPS (AMOXICILLIN) 1 cap by mouth twice a day for 7 days  #14 x 0   Entered and Authorized by:   Angelena Sole MD   Signed by:   Angelena Sole MD on 01/31/2010   Method used:   Electronically to        [facility] (retail)       [address]       [place], [place]  [address]       Ph: [phone]       Fax: [phone]   RxID:   [id number]    Orders Added: 1)  [facility]-  Est  Level 4 [[address]]

## 2010-02-21 NOTE — Miscellaneous (Signed)
Summary: Consent: toenail removal  Consent: toenail removal   Imported By: Knox Royalty 01/23/2010 14:31:52  _____________________________________________________________________  External Attachment:    Type:   Image     Comment:   External Document

## 2010-02-25 ENCOUNTER — Telehealth: Payer: Self-pay | Admitting: Family Medicine

## 2010-02-26 ENCOUNTER — Encounter: Payer: Self-pay | Admitting: Family Medicine

## 2010-02-26 ENCOUNTER — Ambulatory Visit: Payer: BC Managed Care – PPO | Admitting: Family Medicine

## 2010-02-26 DIAGNOSIS — H1045 Other chronic allergic conjunctivitis: Secondary | ICD-10-CM | POA: Insufficient documentation

## 2010-03-07 NOTE — Assessment & Plan Note (Signed)
Summary: allergic conjunctivitis   Vital Signs:  Patient profile:   35 year old female Height:      61.5 inches Weight:      222.31 pounds BMI:     41.47 BSA:     1.99 Temp:     98.0 degrees F Pulse rate:   86 / minute BP sitting:   120 / 72  Vitals Entered By: Jone Baseman CMA (February 26, 2010 9:15 AM) CC: Allergic Conjunctivitis Is Patient Diabetic? No Pain Assessment Patient in pain? no        Primary Care Provider:  Angelena Sole MD  CC:  Allergic Conjunctivitis.  History of Present Illness: Eyes red: Pt has had red ithcy eyes for aboutthe last 2 weeks. She has a h/o allergies and has been taking zyrtec daily but recently has needed to use Benadryl at night to keep herself from scratching her eyes. She has tried something over the counter called OptConA.She has not been using any new make up or cleansing products. The only thing she has started doing recently is using Crystal Light and Pepsi with Aspertame. She is wondering if that could be causing her problems.  No sick contacts at home. Not a lot of drainage from the eye.   Habits & Providers  Alcohol-Tobacco-Diet     Tobacco Status: never     Cigarette Packs/Day: n/a  Current Medications (verified): 1)  Acyclovir 400 Mg Tabs (Acyclovir) .Marland Kitchen.. 1 Tab By Mouth Three Times A Day Starting At [redacted] Weeks Gestation 2)  Fluticasone Propionate 50 Mcg/act Susp (Fluticasone Propionate) .... 2 Sprays Each Nostril Once Daily 3)  Zyrtec Allergy 10 Mg Tabs (Cetirizine Hcl) .Marland Kitchen.. 1 Tab By Mouth Daily For Allergies 4)  Ferrous Sulfate 325 (65 Fe) Mg Tabs (Ferrous Sulfate) .Marland Kitchen.. 1 Tab By Mouth 1-2 Times A Day For Anemia 5)  Zantac 150 Maximum Strength 150 Mg Tabs (Ranitidine Hcl) .... Take 1 Tab By Mouth Daily 6)  Hydrochlorothiazide 25 Mg Tabs (Hydrochlorothiazide) .... Take 1 Tab By Mouth Daily 7)  Labetalol Hcl 200 Mg Tabs (Labetalol Hcl) .Marland Kitchen.. 1 Tab By Mouth Two Times A Day 8)  Pataday 0.2 % Soln (Olopatadine Hcl) .Marland Kitchen.. 1 Drop  Into Each Eye Daily 1 Bottle  Allergies (verified): No Known Drug Allergies  Review of Systems        vitals reviewed and pertinent negatives and positives seen in HPI   Physical Exam  General:  Well-developed,well-nourished,in no acute distress; alert,appropriate and cooperative throughout examination Head:  Normocephalic and atraumatic without obvious abnormalities. No apparent alopecia or balding. Eyes:  some corneal or conjunctival inflammation noted. EOMI. Perrla. Funduscopic exam benign, without hemorrhages, exudates or papilledema. Vision grossly normal. Skin:  facial skin slightly irritated with very fine rash, no erythema.   Impression & Recommendations:  Problem # 1:  ALLERGIC CONJUNCTIVITIS (ICD-372.14) Assessment New Plan to treat wiht Pataday 1 gt daily in each eye. Pt advised to come back to be evaluated if her eye start draining a lot of mucus, she develops blurry vision or any other concerning symptoms.  Orders: [facility]- Est Level  3 ([address])  Complete Medication List: 1)  Acyclovir 400 Mg Tabs (Acyclovir) .Marland Kitchen.. 1 tab by mouth three times a day starting at [redacted] weeks gestation 2)  Fluticasone Propionate 50 Mcg/act Susp (Fluticasone propionate) .... 2 sprays each nostril once daily 3)  Zyrtec Allergy 10 Mg Tabs (Cetirizine hcl) .Marland Kitchen.. 1 tab by mouth daily for allergies 4)  Ferrous Sulfate 325 (65 Fe)  Mg Tabs (Ferrous sulfate) .Marland Kitchen.. 1 tab by mouth 1-2 times a day for anemia 5)  Zantac 150 Maximum Strength 150 Mg Tabs (Ranitidine hcl) .... Take 1 tab by mouth daily 6)  Hydrochlorothiazide 25 Mg Tabs (Hydrochlorothiazide) .... Take 1 tab by mouth daily 7)  Labetalol Hcl 200 Mg Tabs (Labetalol hcl) .Marland Kitchen.. 1 tab by mouth two times a day 8)  Pataday 0.2 % Soln (Olopatadine hcl) .Marland Kitchen.. 1 drop into each eye daily 1 bottle  Patient Instructions: 1)  You appear to have allergic conjunctivitis (eye allergies) 2)  Use the medicine drops once daily.  Prescriptions: PATADAY 0.2 % SOLN  (OLOPATADINE HCL) 1 drop into each eye daily 1 bottle  #1 x 1   Entered and Authorized by:   Jamie Brookes MD   Signed by:   Jamie Brookes MD on 02/26/2010   Method used:   Electronically to        [facility] (retail)       [address]       [place], [place]  [address]       Ph: [phone]       Fax: [phone]   RxID:   [id number]    Orders Added: 1)  [facility]- Est Level  3 [[address]27]

## 2010-03-13 NOTE — Progress Notes (Signed)
Summary: Triage  Phone Note Call from Patient Call back at [phone]   Summary of Call: pt thinks she has pink eye, cannot come in today, wants to know what she can get over the counter? Initial call taken by: Knox Royalty,  February 25, 2010 8:45 AM  Follow-up for Phone Call        states her eyes are not pink, just irritated. has been fighting this for 2-3 weeks. taking her zyrtec & using eye drops. told her best to see a dr. she will call back & make appt for later in  week. unable to come today Follow-up by: Golden Circle RN,  February 25, 2010 8:53 AM

## 2010-04-09 ENCOUNTER — Ambulatory Visit (INDEPENDENT_AMBULATORY_CARE_PROVIDER_SITE_OTHER): Payer: BC Managed Care – PPO | Admitting: Family Medicine

## 2010-04-09 ENCOUNTER — Encounter: Payer: Self-pay | Admitting: Family Medicine

## 2010-04-09 DIAGNOSIS — R319 Hematuria, unspecified: Secondary | ICD-10-CM | POA: Insufficient documentation

## 2010-04-09 DIAGNOSIS — Z309 Encounter for contraceptive management, unspecified: Secondary | ICD-10-CM

## 2010-04-09 LAB — URINALYSIS, ROUTINE W REFLEX MICROSCOPIC
Bilirubin Urine: NEGATIVE
Bilirubin Urine: NEGATIVE
Glucose, UA: NEGATIVE mg/dL
Glucose, UA: NEGATIVE mg/dL
Hgb urine dipstick: NEGATIVE
Ketones, ur: NEGATIVE mg/dL
Ketones, ur: NEGATIVE mg/dL
Nitrite: NEGATIVE
Nitrite: NEGATIVE
Protein, ur: NEGATIVE mg/dL
Protein, ur: NEGATIVE mg/dL
Specific Gravity, Urine: 1.01 (ref 1.005–1.030)
Specific Gravity, Urine: 1.02 (ref 1.005–1.030)
Urobilinogen, UA: 0.2 mg/dL (ref 0.0–1.0)
Urobilinogen, UA: 0.2 mg/dL (ref 0.0–1.0)
pH: 7 (ref 5.0–8.0)
pH: 5.5 (ref 5.0–8.0)

## 2010-04-09 LAB — POCT URINALYSIS DIPSTICK
Bilirubin, UA: NEGATIVE
Blood, UA: NEGATIVE
Glucose, UA: NEGATIVE
Ketones, UA: NEGATIVE
Leukocytes, UA: NEGATIVE
Nitrite, UA: NEGATIVE
Protein, UA: NEGATIVE
Spec Grav, UA: 1.015
Urobilinogen, UA: 1
pH, UA: 6

## 2010-04-09 LAB — COMPREHENSIVE METABOLIC PANEL WITH GFR
ALT: 104 U/L — ABNORMAL HIGH (ref 0–35)
ALT: 62 U/L — ABNORMAL HIGH (ref 0–35)
ALT: 79 U/L — ABNORMAL HIGH (ref 0–35)
AST: 36 U/L (ref 0–37)
AST: 46 U/L — ABNORMAL HIGH (ref 0–37)
AST: 75 U/L — ABNORMAL HIGH (ref 0–37)
Albumin: 3.1 g/dL — ABNORMAL LOW (ref 3.5–5.2)
Albumin: 3.2 g/dL — ABNORMAL LOW (ref 3.5–5.2)
Albumin: 3.2 g/dL — ABNORMAL LOW (ref 3.5–5.2)
Alkaline Phosphatase: 107 U/L (ref 39–117)
Alkaline Phosphatase: 117 U/L (ref 39–117)
Alkaline Phosphatase: 117 U/L (ref 39–117)
BUN: 6 mg/dL (ref 6–23)
BUN: 6 mg/dL (ref 6–23)
BUN: 7 mg/dL (ref 6–23)
CO2: 23 meq/L (ref 19–32)
CO2: 24 meq/L (ref 19–32)
CO2: 24 meq/L (ref 19–32)
Calcium: 9.1 mg/dL (ref 8.4–10.5)
Calcium: 9.3 mg/dL (ref 8.4–10.5)
Calcium: 9.4 mg/dL (ref 8.4–10.5)
Chloride: 107 meq/L (ref 96–112)
Chloride: 109 meq/L (ref 96–112)
Chloride: 110 meq/L (ref 96–112)
Creatinine, Ser: 0.65 mg/dL (ref 0.4–1.2)
Creatinine, Ser: 0.79 mg/dL (ref 0.4–1.2)
Creatinine, Ser: 0.81 mg/dL (ref 0.4–1.2)
GFR calc non Af Amer: >60 mL/min
GFR calc non Af Amer: >60 mL/min
GFR calc non Af Amer: >60 mL/min
Glucose, Bld: 80 mg/dL (ref 70–99)
Glucose, Bld: 82 mg/dL (ref 70–99)
Glucose, Bld: 95 mg/dL (ref 70–99)
Potassium: 3.5 meq/L (ref 3.5–5.1)
Potassium: 3.7 meq/L (ref 3.5–5.1)
Potassium: 4.1 meq/L (ref 3.5–5.1)
Sodium: 137 meq/L (ref 135–145)
Sodium: 138 meq/L (ref 135–145)
Sodium: 140 meq/L (ref 135–145)
Total Bilirubin: 0.4 mg/dL (ref 0.3–1.2)
Total Bilirubin: 0.5 mg/dL (ref 0.3–1.2)
Total Bilirubin: 0.6 mg/dL (ref 0.3–1.2)
Total Protein: 6.3 g/dL (ref 6.0–8.3)
Total Protein: 6.3 g/dL (ref 6.0–8.3)
Total Protein: 6.4 g/dL (ref 6.0–8.3)

## 2010-04-09 LAB — URIC ACID: Uric Acid, Serum: 6 mg/dL (ref 2.4–7.0)

## 2010-04-09 LAB — CBC
HCT: 34 % — ABNORMAL LOW (ref 36.0–46.0)
HCT: 35.5 % — ABNORMAL LOW (ref 36.0–46.0)
HCT: 39.6 % (ref 36.0–46.0)
HCT: 41.7 % (ref 36.0–46.0)
Hemoglobin: 11.8 g/dL — ABNORMAL LOW (ref 12.0–15.0)
Hemoglobin: 14.3 g/dL (ref 12.0–15.0)
MCHC: 34.6 g/dL (ref 30.0–36.0)
MCHC: 34.3 g/dL (ref 30.0–36.0)
MCV: 90.1 fL (ref 78.0–100.0)
MCV: 90.1 fL (ref 78.0–100.0)
MCV: 89 fL (ref 78.0–100.0)
MCV: 89 fL (ref 78.0–100.0)
Platelets: 213 K/uL (ref 150–400)
Platelets: 242 10*3/uL (ref 150–400)
Platelets: 177 10*3/uL (ref 150–400)
Platelets: 188 K/uL (ref 150–400)
RBC: 3.78 MIL/uL — ABNORMAL LOW (ref 3.87–5.11)
RBC: 4.69 MIL/uL (ref 3.87–5.11)
RDW: 14.6 % (ref 11.5–15.5)
RDW: 14.8 % (ref 11.5–15.5)
RDW: 14.6 % (ref 11.5–15.5)
RDW: 14.9 % (ref 11.5–15.5)
WBC: 9.2 K/uL (ref 4.0–10.5)
WBC: 10.3 K/uL (ref 4.0–10.5)

## 2010-04-09 LAB — URINE MICROSCOPIC-ADD ON

## 2010-04-09 LAB — COMPREHENSIVE METABOLIC PANEL
Albumin: 3.2 g/dL — ABNORMAL LOW (ref 3.5–5.2)
BUN: 10 mg/dL (ref 6–23)
Calcium: 9.8 mg/dL (ref 8.4–10.5)
Creatinine, Ser: 0.64 mg/dL (ref 0.4–1.2)
Potassium: 3.9 mEq/L (ref 3.5–5.1)
Total Protein: 6.2 g/dL (ref 6.0–8.3)

## 2010-04-09 LAB — LACTATE DEHYDROGENASE: LDH: 175 U/L (ref 94–250)

## 2010-04-09 MED ORDER — MEDROXYPROGESTERONE ACETATE 150 MG/ML IM SUSP
150.0000 mg | Freq: Once | INTRAMUSCULAR | Status: AC
  Administered 2010-04-09: 150 mg via INTRAMUSCULAR

## 2010-04-09 NOTE — Patient Instructions (Signed)
There was no blood in the urine today What most likely happened was that you had a minor infection that you didn't have symptoms from that has resolved If the blood returns please let us know Schedule a follow up appointment as needed

## 2010-04-09 NOTE — Assessment & Plan Note (Signed)
Negative UA in clinic.  Normal exam.  No fevers / flank pain.  No signs of serious infection or pathology.  Reassurance.  Follow up as needed.

## 2010-04-09 NOTE — Progress Notes (Signed)
  Subjective:    Patient ID: Amy Miller, female    DOB: Oct 10, 1975, 35 y.o.   MRN: [id number]  Hematuria This is a new problem. Episode onset: Noticed it x 1 on Monday. The problem has been resolved since onset. She describes the hematuria as gross hematuria. Timing: unsure. She reports no clotting in her urine stream. Her pain is at a severity of 0/10. She is experiencing no pain. She describes her urine color as clear. Irritative symptoms do not include frequency, nocturia or urgency. Obstructive symptoms do not include dribbling, incomplete emptying, an intermittent stream, a slower stream, straining or a weak stream. Associated symptoms include nausea. Pertinent negatives include no abdominal pain, chills, dysuria, fever, flank pain, genital pain, inability to urinate or vomiting. She is not sexually active. Her past medical history is significant for hypertension. There is no history of BPH, kidney stones, recent infection or tobacco use.      Review of Systems  Constitutional: Negative for fever and chills.  Gastrointestinal: Positive for nausea. Negative for vomiting and abdominal pain.  Genitourinary: Positive for hematuria. Negative for dysuria, urgency, frequency, flank pain, incomplete emptying and nocturia.       Objective:   Physical Exam  Constitutional: She appears well-developed and well-nourished. No distress.  Cardiovascular: Normal rate and regular rhythm.   Pulmonary/Chest: Effort normal and breath sounds normal.  Abdominal: Soft. Bowel sounds are normal. She exhibits no distension. There is no tenderness. There is no rebound.  Musculoskeletal:       No flank tenderness  Skin: Skin is warm and dry. No erythema. No pallor.  Psychiatric: She has a normal mood and affect.          Assessment & Plan:

## 2010-04-24 LAB — GLUCOSE, CAPILLARY
Glucose-Capillary: 93 mg/dL (ref 70–99)
Glucose-Capillary: 136 mg/dL — ABNORMAL HIGH (ref 70–99)

## 2010-05-06 ENCOUNTER — Inpatient Hospital Stay (INDEPENDENT_AMBULATORY_CARE_PROVIDER_SITE_OTHER)
Admission: RE | Admit: 2010-05-06 | Discharge: 2010-05-06 | Disposition: A | Discharge status: Home / Self Care | Payer: BC Managed Care – PPO | Source: Ambulatory Visit | Attending: Emergency Medicine | Admitting: Emergency Medicine

## 2010-05-06 DIAGNOSIS — L600 Ingrowing nail: Secondary | ICD-10-CM

## 2010-05-06 DIAGNOSIS — J019 Acute sinusitis, unspecified: Secondary | ICD-10-CM

## 2010-06-07 NOTE — Op Note (Signed)
Amy Miller, Amy Miller                ACCOUNT NO.:  [id number]   MEDICAL RECORD NO.:  [id number]          PATIENT TYPE:  AMB   LOCATION:  SDC                           FACILITY:  [facility]   PHYSICIAN:  Phil D. Okey Dupre, M.D.     DATE OF BIRTH:  [birth year]   DATE OF PROCEDURE:  12/04/2004  DATE OF DISCHARGE:                                 OPERATIVE REPORT   PREOPERATIVE DIAGNOSIS:  Incomplete abortion.   POSTOPERATIVE DIAGNOSIS:  Incomplete abortion.   OPERATION/PROCEDURE:  Suction dilatation and curettage.   SURGEON:  Javier Glazier. Okey Dupre, M.D.   ASSISTANT:  Marc Morgans. Mayford Knife, M.D.   ANESTHESIA:  Paracervical block, MAC anesthesia.   COMPLICATIONS:  None.   ESTIMATED BLOOD LOSS:  Approximately 50 mL.   INDICATIONS:  The patient is a 35 year old, gravida 1, now para 0-0-1-0, LMP  in late September who had a miscarriage November 29, 2004.  Subsequent  ultrasound showed that she still had retained products of conception.  The  patient was getting ready to go on vacation.  She was worried that she would  have increased bleeding and wanted to have it taken care of immediately.  She was counseled on the risks and benefits and wanted to proceed with the  procedure.   FINDINGS:  Anteverted uterus, moderate amount of products of conception.   DESCRIPTION OF PROCEDURE:  The patient was taken to the operating room,  where __________ and cervix and vagina were ___________ Betadine.  A  weighted speculum was placed in the patient's vagina.  The cervix was noted  to be minimally dilated.  A tenaculum was placed in the anterior lip of the  cervix, then lidocaine was injected for paracervical block at the 4 and 8  o'clock positions, 10 mL.   The uterus was gently sounded to 9 cm and the Hegar were used to dilate the  cervical os to an 8.  Next, an 8 mm suction curettage was advanced gently  through the uterine fundus.  The suction device was activated and the curet  rotated in a clockwise direction and  slowly withdrawn from the uterus.  Moderate amount of products of conception was obtained.  Next, the suction  device was reinserted and rotated in the counterclockwise position.  Once  again minimal products of conception were obtained.  Finally, a third pass  was made and a very minimal amount of bleeding was evacuated.  There was  minimal bleeding throughout the procedure.  Tenaculum was removed with  good hemostasis noted.  The patient tolerated the procedure well and the  patient was taken to the recovery room in stable condition.   PATHOLOGY:  Products of conception.     ______________________________  Marc Morgans Mayford Knife, M.D.    ______________________________  Javier Glazier. Okey Dupre, M.D.    TLW/MEDQ  D:  12/04/2004  T:  12/04/2004  Job:  [id number]

## 2010-06-07 NOTE — Op Note (Signed)
[place]. [facility]  Patient:    KYNZLI, REASE Visit Number: [id number] MRN: [id number]          Service Type: DSU Location: [facility] Attending Physician:  Lucky Cowboy Dictated by:   Lucky Cowboy, M.D. Proc. 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  PROCEDURE:  Bilateral inferior turbinate reduction.  SURGEON:  Lucky Cowboy, M.D.  ANESTHESIA:  General endotracheal anesthesia.  ESTIMATED BLOOD LOSS:  30 cc.  SPECIMENS:  None.  COMPLICATIONS:  None.  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  This has been gradual in onset.  She has tried Forensic psychologist without relief of obstructive symptoms.  She is now using Afrin every other day.  She was found to have profuse bilateral inferior turbinate hypertrophy.  There was no evidence of sinusitis.  FINDINGS:  The patient was noted to have both bony and mucosal prominent inferior turbinate hypertrophy, bilaterally.  DESCRIPTION OF PROCEDURE:  The patient was taken to the operating room and placed on the table in supine position.  She was then placed under general endotracheal anesthesia and the table rotated counterclockwise 90 degrees. The face was prepped with Betadine and draped in the usual sterile fashion. Each of the nasal cavities were decongested with Afrin on cottonoid pledgets. Lidocaine 1% with 1:100,000 of epinephrine was then used for vasoconstriction to inject both of the inferior turbinates.  A #15 blade was then used to make an incision along the inferior margin of the left inferior turbinate.  Mucosal flaps were elevated medially and laterally.   Redundant bone was taken out using sinus scissors.  Redundant mucosa was taken down approximately one-half of the turbinate from anterior to posterior.  Suction cautery was used for hemostasis and the left nasal cavity packed with a Merocel pack.  The right inferior turbinate was performed in an identical fashion.  Suction cautery was used for hemostasis after reduction of the turbinate.  A pack was placed. These were tied to one another anterior to the columella.  The oral cavity was then suctioned out and the table rotated clockwise 90 degrees to its original position.  The patient was awakened from anesthesia and taken to the postanesthesia care unit in stable condition.  There were no complications.  DISCHARGE MEDICATIONS:  Vicodin one to two p.o. q.4-6h. p.r.n. pain, cephalexin 500 mg one p.o. q.i.d. for seven days. Dictated by:   Lucky Cowboy, M.D. Attending Physician:  Lucky Cowboy DD:  10/16/00 TD:  10/16/00 Job: [id number]

## 2010-07-09 ENCOUNTER — Ambulatory Visit (INDEPENDENT_AMBULATORY_CARE_PROVIDER_SITE_OTHER): Payer: BC Managed Care – PPO | Admitting: Family Medicine

## 2010-07-09 ENCOUNTER — Encounter: Payer: Self-pay | Admitting: Family Medicine

## 2010-07-09 DIAGNOSIS — A6000 Herpesviral infection of urogenital system, unspecified: Secondary | ICD-10-CM

## 2010-07-09 DIAGNOSIS — Z309 Encounter for contraceptive management, unspecified: Secondary | ICD-10-CM

## 2010-07-09 DIAGNOSIS — L600 Ingrowing nail: Secondary | ICD-10-CM

## 2010-07-09 MED ORDER — HYDROCODONE-ACETAMINOPHEN 5-500 MG PO TABS: 1.0000 | ORAL_TABLET | Freq: Four times a day (QID) | ORAL | Status: DC | PRN

## 2010-07-09 MED ORDER — MEDROXYPROGESTERONE ACETATE 150 MG/ML IM SUSP
150.0000 mg | Freq: Once | INTRAMUSCULAR | Status: AC
  Administered 2010-07-09: 150 mg via INTRAMUSCULAR

## 2010-07-09 MED ORDER — ACYCLOVIR 400 MG PO TABS: 400.0000 mg | ORAL_TABLET | Freq: Three times a day (TID) | ORAL | Status: DC

## 2010-07-09 NOTE — Assessment & Plan Note (Signed)
?   Active outbreak.  No active lesions.  Will treat because of symptoms.

## 2010-07-09 NOTE — Patient Instructions (Addendum)
Toenail Removal Toenails may need to be removed because of injury, infections or to correct abnormal growth. A special non-stick bandage will likely be put tightly on your toe to prevent bleeding. Often times a new nail will grow back. Sometimes the new nail may be deformed. Most of the time when a nail is lost, it will gradually heal, but may be sensitive for a long time. HOME CARE INSTRUCTIONS  Keep your foot elevated to relieve pain and swelling. This will require lying in bed or on a couch with the leg on pillows or sitting in a recliner with the leg up. Walking or letting your leg dangle may increase swelling, slow healing and cause throbbing pain.   Keep your bandage dry and clean.   Change your bandage in 24 hours.   After your bandage is changed, soak your foot in warm, soapy water for 10 to 20 minutes. Do this 3 times per day. This helps reduce pain and swelling. After soaking your foot, apply a clean, dry bandage. Change your bandage if it is wet or dirty.   Only take over-the-counter or prescription medicines for pain, discomfort, or fever as directed by your caregiver.   See your caregiver as needed for problems.   Please schedule an appointment in 1 week to check on the toe SEEK IMMEDIATE MEDICAL CARE IF:  You have increased pain, swelling, redness & warmth (inflammation), drainage or bleeding.   An oral temperature above 100.4 develops, not controlled by medication.   You have swelling that spreads from your toe into your foot.  YOU MIGHT NEED A TETANUS SHOT NOW IF:  You have no idea when you had the last one.   You have never had a tetanus shot before.   The injured area had dirt in it.  If you need a tetanus shot, and you decide not to get one, there is a rare chance of getting tetanus. Sickness from tetanus can be serious. If you did get a tetanus shot, your arm may swell, get red and warm to the touch at the shot site. This is common and not a problem. Document  Released: 10/05/2002 Document Re-Released: 04/04/2008 [facility] Patient Information 2011 [place], [place].

## 2010-07-09 NOTE — Progress Notes (Signed)
  Subjective:    Patient ID: Amy Miller, female    DOB: 18-Aug-1975, 35 y.o.   MRN: [id number]  HPI 1. Ingrown toenail:  Right big toe on the lateral side.  She had this happen before and it did require surgery.   She has noticed it for a couple of months.  It is very painful to touch.  It has not been draining.  2. Herpes outbreak:  She is concerned that she may be having a Herpes outbreak.  She is complaining of genital pain and itching.  She denies any lesions or vesicles.  She is not on suppressive therapy   Review of Systems Denies fevers, chills.  Denies vaginal discharge.  Denies dysuria.    Objective:   Physical Exam  Constitutional: She appears well-developed and well-nourished. No distress.  Cardiovascular: Normal rate and regular rhythm.   Pulmonary/Chest: Effort normal and breath sounds normal.  Abdominal: Soft. Bowel sounds are normal. She exhibits no distension. There is no tenderness.  Skin: Skin is warm and dry. No erythema.       Ingrown toenail of right big toe on the lateral side  Psychiatric: She has a normal mood and affect.          Assessment & Plan:

## 2010-07-09 NOTE — Assessment & Plan Note (Addendum)
Procedure note:  Pt was consented to procedure.  Site was prepped and draped in normal sterile fashion.  Digital nerve block with Lidocaine without epi was performed on the big toe.  The big toe was then placed in a turniquette with a rubber band.  The lateral edge of the big toe nail was excised with excision clippers.  The lateral nail bed was then touched with phenol to help keep the nail from growing back.  Turniquette removed with normal return of blood flow.  Minimal blood loss.  Patient tolerated procedure well.  Reviewed normal precautions.  Handout given.

## 2010-07-23 ENCOUNTER — Ambulatory Visit (INDEPENDENT_AMBULATORY_CARE_PROVIDER_SITE_OTHER): Payer: BC Managed Care – PPO | Admitting: Family Medicine

## 2010-07-23 ENCOUNTER — Encounter: Payer: Self-pay | Admitting: Family Medicine

## 2010-07-23 VITALS — BP 136/56 | HR 89 | Temp 99.0°F | Wt 218.0 lb

## 2010-07-23 DIAGNOSIS — L600 Ingrowing nail: Secondary | ICD-10-CM

## 2010-07-23 NOTE — Progress Notes (Signed)
  Subjective:    Patient ID: Amy Miller, female    DOB: 03-11-1975, 35 y.o.   MRN: [id number]  HPI  Patient presents for follow up of toe pain after removal of ingrown toe nail.  She denies any continued pain, erythema, drainage.  She feels like the nail is growing back straight without issues.    Review of Systems Pertinent items noted in HPI.     Objective:   Physical Exam Filed Vitals:   07/23/10 1552  BP: 136/56  Pulse: 89  Temp: 99 F (37.2 C)   Extremities: Right toe: without erythema, edema.  Nail s/p lateral laminectomy, growing back, some overgrowth of cuticle on lateral side.  Otherwise WNL.         Assessment & Plan:  No further management necessary.  Advised pt. Continue to try to push cuticle back after soaking foot.

## 2010-07-23 NOTE — Assessment & Plan Note (Signed)
No further management necessary. Advised pt. Continue to try to push cuticle back after soaking foot.

## 2010-09-19 ENCOUNTER — Encounter: Payer: Self-pay | Admitting: Family Medicine

## 2010-09-19 ENCOUNTER — Ambulatory Visit (INDEPENDENT_AMBULATORY_CARE_PROVIDER_SITE_OTHER): Payer: BC Managed Care – PPO | Admitting: Family Medicine

## 2010-09-19 VITALS — BP 110/70 | HR 84 | Temp 98.6°F | Ht 61.75 in | Wt 220.0 lb

## 2010-09-19 DIAGNOSIS — L089 Local infection of the skin and subcutaneous tissue, unspecified: Secondary | ICD-10-CM

## 2010-09-19 DIAGNOSIS — IMO0002 Reserved for concepts with insufficient information to code with codable children: Secondary | ICD-10-CM

## 2010-09-19 DIAGNOSIS — Z309 Encounter for contraceptive management, unspecified: Secondary | ICD-10-CM

## 2010-09-19 DIAGNOSIS — L02419 Cutaneous abscess of limb, unspecified: Secondary | ICD-10-CM

## 2010-09-19 MED ORDER — SULFAMETHOXAZOLE-TMP DS 800-160 MG PO TABS: 1.0000 | ORAL_TABLET | Freq: Two times a day (BID) | ORAL | Status: AC

## 2010-09-19 MED ORDER — MEDROXYPROGESTERONE ACETATE 150 MG/ML IM SUSP
150.0000 mg | Freq: Once | INTRAMUSCULAR | Status: AC
  Administered 2010-09-19: 150 mg via INTRAMUSCULAR

## 2010-09-19 NOTE — Progress Notes (Signed)
  Subjective:    Patient ID: Amy Miller, female    DOB: 17-Dec-1975, 35 y.o.   MRN: [id number]  HPI 1. Axillary Abscess Several day history of enlarging abscess in right axilla,. Painful, hot, tender, erythematous. No fevers. No rashes. No hx of surgery or antibiotic usage.   2. Left Toe/nail infection Had a partial toenail removed on the medial side of her right first toe. She has since developed a purulent discharge from the edge of the nail and small collection and inflammation of the toe.    Review of Systems  Constitutional: Negative for fever, chills and diaphoresis.  Musculoskeletal: Negative for joint swelling.  Skin: Positive for wound. Negative for rash.  Hematological: Negative for adenopathy.       Objective:   Physical Exam  Nursing note and vitals reviewed. Musculoskeletal:       Arms:      Right foot: She exhibits tenderness.       Feet:       Purulent drainage from collection and induration/erythema of toe.  Right axilla: fluctuant 3x3 cm abscess with purulent drainage.  Skin: No rash noted.      Assessment & Plan:  1. Axillary Abscess - I+D performed. Packed with 1 inch packing strip - remove packing in 24 hours. Keep clean and dry.  2. Left Toe/nail infection - I+D with blunt q-tip dissection, cleansed with hydrogen peroxide.  - bactrim DS for 7 days.

## 2010-09-19 NOTE — Patient Instructions (Signed)
Remove packing from armpit wound in 24 hours. Keep area clean and dry. Take antibiotics for 7 days.  Return to clinic as needed.

## 2010-10-01 ENCOUNTER — Telehealth: Payer: Self-pay | Admitting: Family Medicine

## 2010-10-01 NOTE — Telephone Encounter (Signed)
Pt inquiring about last tetanus shot.  Advised of date [employer], [employer]

## 2010-10-01 NOTE — Telephone Encounter (Signed)
Has questions about her shot record

## 2010-10-14 ENCOUNTER — Telehealth: Payer: Self-pay | Admitting: Family Medicine

## 2010-10-14 NOTE — Telephone Encounter (Signed)
Needs nurse to call about her shot record

## 2010-10-14 NOTE — Telephone Encounter (Signed)
Wanted copy of immunization faxed to her.  Faxed to [phone] [place], Amy Miller

## 2010-10-17 ENCOUNTER — Other Ambulatory Visit: Payer: Self-pay | Admitting: Family Medicine

## 2010-10-17 MED ORDER — HYDROCHLOROTHIAZIDE 25 MG PO TABS: 25.0000 mg | ORAL_TABLET | Freq: Every day | ORAL | Status: DC

## 2011-01-16 ENCOUNTER — Ambulatory Visit (INDEPENDENT_AMBULATORY_CARE_PROVIDER_SITE_OTHER): Payer: BC Managed Care – PPO | Admitting: Family Medicine

## 2011-01-16 ENCOUNTER — Encounter: Payer: Self-pay | Admitting: Family Medicine

## 2011-01-16 VITALS — BP 137/59 | HR 91 | Temp 98.3°F | Ht 61.75 in | Wt 216.0 lb

## 2011-01-16 DIAGNOSIS — J0100 Acute maxillary sinusitis, unspecified: Secondary | ICD-10-CM

## 2011-01-16 DIAGNOSIS — R011 Cardiac murmur, unspecified: Secondary | ICD-10-CM

## 2011-01-16 MED ORDER — AMOXICILLIN-POT CLAVULANATE 875-125 MG PO TABS: 1.0000 | ORAL_TABLET | Freq: Two times a day (BID) | ORAL | Status: AC

## 2011-01-16 NOTE — Assessment & Plan Note (Addendum)
Patient found to have murmur today. She may have been told she had a murmur in the past while she was pregnant.  She did have some chest pain a few days ago but this was worse with palpation and inspiration and non-exertional, so it seems more likely related to her acute respiratory illness. Discussed red flags with patient regarding chest pain today.  I have asked her to follow-up with her PCP in about a month regarding her chest pain (after your acute illness resolves) and for possible evaluation of the murmur.

## 2011-01-16 NOTE — Progress Notes (Signed)
  Subjective:    Patient ID: Amy Miller, female    DOB: February 16, 1975, 35 y.o.   MRN: [id number]  HPI Acute visit for sinusitis:  Happens a few times a year. Antibiotics help. Symptoms: sinus pressure x 2-3 weeks.  Progression: worsening Associated symptoms: runny nose, headache; denies fevers, difficulty breathing; has also been having substernal chest pressure for a few days, now resolved (reproducible, worse with deep breathing) Medications: allergy medicines and steroid nasal spray not helping  Triggers: changes in weather/season Alleviated by: nothing Exacerbated by: nothing   Review of Systems Per HPI    Objective:   Physical Exam Gen: appears mildly uncomfortable HEENT:   Head: tender over maxillary sinuses bilaterally    Eyes: normal without erythema or tearing   Ears: TM clear bilaterally   Nose: no nasal discharge; congested   Throat: no erythema or exudates; no tonsillar adenopathy   Neck: no LAD Pulm: CTAB CV: 3/6 systolic murmur RUSB    Assessment & Plan:

## 2011-01-16 NOTE — Patient Instructions (Signed)
Take the Augmentin twice daily for 5 days.   Make an appointment with Dr. Lula Olszewski to follow-up on your chest pain and your murmur in about 1 month after your sinus infection has cleared.  Happy New Year.

## 2011-01-16 NOTE — Assessment & Plan Note (Signed)
Sinusitis symptoms 2-3 weeks now. 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

## 2011-01-17 ENCOUNTER — Ambulatory Visit: Payer: BC Managed Care – PPO | Admitting: Family Medicine

## 2011-02-19 ENCOUNTER — Ambulatory Visit: Payer: BC Managed Care – PPO | Admitting: Family Medicine

## 2011-02-25 ENCOUNTER — Other Ambulatory Visit: Payer: Self-pay | Admitting: Family Medicine

## 2011-02-25 NOTE — Telephone Encounter (Signed)
Refill request

## 2011-03-03 ENCOUNTER — Ambulatory Visit: Payer: BC Managed Care – PPO | Admitting: Family Medicine

## 2011-03-14 ENCOUNTER — Ambulatory Visit: Payer: BC Managed Care – PPO | Admitting: Family Medicine

## 2011-03-18 ENCOUNTER — Encounter: Payer: Self-pay | Admitting: Family Medicine

## 2011-03-18 ENCOUNTER — Ambulatory Visit (INDEPENDENT_AMBULATORY_CARE_PROVIDER_SITE_OTHER): Payer: BC Managed Care – PPO | Admitting: Family Medicine

## 2011-03-18 VITALS — BP 112/72 | HR 93 | Temp 98.7°F | Ht 61.75 in | Wt 215.0 lb

## 2011-03-18 DIAGNOSIS — R011 Cardiac murmur, unspecified: Secondary | ICD-10-CM

## 2011-03-18 NOTE — Assessment & Plan Note (Signed)
New murmur heard for the first time 01/16/11. Asymptomatic, but still present today.  Will order ECHO to evaluate.  Pt given red flag signs for cardiac symptoms.  She voices understanding.

## 2011-03-18 NOTE — Progress Notes (Signed)
  Subjective:    Patient ID: Amy Miller, female    DOB: 1975-02-19, 36 y.o.   MRN: [id number]  HPI  Pt presents for follow up of murmur, that was new last visit.  She had a URI at that time.  She says she may have had a murmur when she was pregnant, but is not sure.  She says the cold has gotten better.  The patient denies any chest pain, dyspnea, lightheadedness, vision changes, or leg swelling.  She has never passed out while exercising or otherwise.   Review of Systems Pertinent Items in HPI.     Objective:   Physical Exam BP 112/72  Pulse 93  Temp(Src) 98.7 F (37.1 C) (Oral)  Ht 5' 1.75" (1.568 m)  Wt 215 lb (97.523 kg)  BMI 39.64 kg/m2 General appearance: alert, cooperative and no distress Head: Normocephalic, without obvious abnormality, atraumatic Eyes: PERRL, EOMIT. Neck: no adenopathy, no carotid bruit, no JVD, supple, symmetrical, trachea midline and thyroid not enlarged, symmetric, no tenderness/mass/nodules Lungs: clear to auscultation bilaterally Heart: 2/6 systolic murmur heard, regular rate and rhythm.        Assessment & Plan:

## 2011-03-18 NOTE — Patient Instructions (Addendum)
It was nice to meet you.  I am glad you are feeling better.    Because of your heart murmur, I am ordering an ultrasound of your heart called an Echocardiogram.  You will go to the Hospital, and check in at admitting, they will take you to the Echo lab.  They will send me the report.   If you have any chest pain, light headedness, shortness of breath, or leg swelling, please call the office or seek medical care.    Please continue your regular medications as prescribed.

## 2011-04-02 ENCOUNTER — Ambulatory Visit (HOSPITAL_COMMUNITY): Payer: BC Managed Care – PPO

## 2011-04-07 ENCOUNTER — Ambulatory Visit (HOSPITAL_COMMUNITY)
Admission: RE | Admit: 2011-04-07 | Discharge: 2011-04-07 | Disposition: A | Discharge status: Home / Self Care | Payer: BC Managed Care – PPO | Source: Ambulatory Visit | Attending: Family Medicine | Admitting: Family Medicine

## 2011-04-07 DIAGNOSIS — I079 Rheumatic tricuspid valve disease, unspecified: Secondary | ICD-10-CM | POA: Insufficient documentation

## 2011-04-07 DIAGNOSIS — I059 Rheumatic mitral valve disease, unspecified: Secondary | ICD-10-CM | POA: Insufficient documentation

## 2011-04-07 DIAGNOSIS — K219 Gastro-esophageal reflux disease without esophagitis: Secondary | ICD-10-CM | POA: Insufficient documentation

## 2011-04-07 DIAGNOSIS — I379 Nonrheumatic pulmonary valve disorder, unspecified: Secondary | ICD-10-CM | POA: Insufficient documentation

## 2011-04-07 DIAGNOSIS — R011 Cardiac murmur, unspecified: Secondary | ICD-10-CM | POA: Insufficient documentation

## 2011-04-07 NOTE — Progress Notes (Signed)
  Echocardiogram 2D Echocardiogram has been performed.  Amy Miller [facility] 04/07/2011, 2:42 PM

## 2011-04-11 ENCOUNTER — Telehealth: Payer: Self-pay | Admitting: Family Medicine

## 2011-04-11 NOTE — Telephone Encounter (Signed)
Is wanting to know results of Echo from Monday.  Would like to know as soon as possible

## 2011-04-15 NOTE — Telephone Encounter (Signed)
Called Amy Miller and let her know her ECHO was totally normal- OK to resume regular activities/exercise.

## 2011-09-15 ENCOUNTER — Other Ambulatory Visit: Payer: Self-pay | Admitting: *Deleted

## 2011-09-15 MED ORDER — HYDROCHLOROTHIAZIDE 25 MG PO TABS: 25.0000 mg | ORAL_TABLET | Freq: Every day | ORAL | Status: DC

## 2011-12-29 ENCOUNTER — Ambulatory Visit (INDEPENDENT_AMBULATORY_CARE_PROVIDER_SITE_OTHER): Payer: BC Managed Care – PPO | Admitting: Family Medicine

## 2011-12-29 ENCOUNTER — Encounter: Payer: Self-pay | Admitting: Family Medicine

## 2011-12-29 VITALS — BP 118/72 | HR 80 | Temp 99.0°F | Ht 61.7 in | Wt 207.0 lb

## 2011-12-29 DIAGNOSIS — M25569 Pain in unspecified knee: Secondary | ICD-10-CM

## 2011-12-29 DIAGNOSIS — F329 Major depressive disorder, single episode, unspecified: Secondary | ICD-10-CM

## 2011-12-29 MED ORDER — ZOLPIDEM TARTRATE 5 MG PO TABS: 5.0000 mg | ORAL_TABLET | Freq: Every evening | ORAL | Status: DC | PRN

## 2011-12-29 MED ORDER — SERTRALINE HCL 50 MG PO TABS: 50.0000 mg | ORAL_TABLET | Freq: Every day | ORAL | Status: DC

## 2011-12-29 MED ORDER — TRAMADOL HCL 50 MG PO TABS: 50.0000 mg | ORAL_TABLET | Freq: Three times a day (TID) | ORAL | Status: DC | PRN

## 2011-12-29 MED ORDER — MELOXICAM 15 MG PO TABS: 15.0000 mg | ORAL_TABLET | Freq: Every day | ORAL | Status: DC

## 2011-12-29 NOTE — Patient Instructions (Signed)
It was good to see you.  I will refer you to Orthopedics for your knee pain.  Please try taking the meloxicam every day with food and the tramadol as needed for severe pain.   For your anxeity, depression, and insomina, please start taking the zoloft once daily.  Remember you have to take it every day and it will take about one month to start working.  You can also use the ambien as needed for sleep.

## 2011-12-29 NOTE — Progress Notes (Signed)
  Subjective:    Patient ID: Amy Miller, female    DOB: [birth year], 36 y.o.   MRN: [id number]  HPI  Amy Miller comes in with a few complaints.   She says her right knee has been bothering her quite a bit over the past 6 months or so.  She had a knee scope for a bucket handle tear (she does not remember if it was medial or lateral).  She thinks the surgery was in 2006, and she thinks she saw [facility]. She says that she has had popping and significant pain in her knee over the past month, and that she feels like the knee will give way, especially when going up stairs.  She says there is intermittent swelling.  No new injury.   Patient also complains of depression that has been going on for 2 years since her daughter was born.  She sees a therapist, but has failed to make progress lately.  She complains of anhedonia, insomnia, mood swings with tearfulness and anger.  She says her therapist asks for me to prescribe Xanax for sleep and an antidepressant. She says her therapist keeps close tabs on her.   I have reviewed the patient's medical history in detail and updated the computerized patient record.  Review of Systems Pertinent items in HPI    Objective:   Physical Exam BP 118/72  Pulse 80  Temp 99 F (37.2 C) (Oral)  Ht 5' 1.7" (1.567 m)  Wt 207 lb (93.895 kg)  BMI 38.23 kg/m2 General appearance: alert and cooperative, tearful Psych: Tearful, mood is slightly dysphoric, affect appropriate, insight and judgement seem in tact, and patient does not appear to be responding to internal stimuli.  No SI/HI Right Knee Knee: Normal to inspection with no erythema or effusion or obvious bony abnormalities. Palpation + crepitus ROM decreased in flexion and normal extension Ligaments with solid consistent endpoints including ACL, PCL, LCL, MCL. Negative Mcmurray's and provocative meniscal tests. Non painful patellar compression. Patellar and quadriceps tendons unremarkable. Hamstring  and quadriceps strength is normal.        Assessment & Plan:

## 2011-12-29 NOTE — Assessment & Plan Note (Signed)
Will start zoloft for depression, ambien prn for sleep.  Told patient that I do not prescribe benzodiazepines for sleep, and avoid them in general because they are addictive.  Discussed importance of sleep hygiene, and that zoloft must be taken daily. F/U in on e month.

## 2011-12-29 NOTE — Assessment & Plan Note (Signed)
No new injury but patient with significant discomfort.  May be developing arthritis considering obesity and hx of meniscal tear and menisectomy.  She would like to got back to her orthopedist.  I will make referral.  Rx for meloxicam scheduled and tramadol prn.

## 2012-02-21 ENCOUNTER — Other Ambulatory Visit: Payer: Self-pay | Admitting: Family Medicine

## 2012-02-26 NOTE — H&P (Signed)
Amy Miller DOB: 12-24-1975  Chief Complaint: right knee pain  History of Present Illness The patient is a 37 year old female who presented with knee complaints. The patient reports right knee symptoms including: pain which began 1 year ago without any known injury. Previous work-up for this problem has included knee x-rays and knee MRI. Symptoms are reported to be located in the right knee and include knee pain, swelling and locking. Dr. Darrelyn Hillock did a lateral meniscectomy for a bucket-handle tear of the lateral meniscus in May 2009, 4 1/2 years ago. She now, in the past year, feels a catching and locking in her knee. The knee feels weak and gives way.MRI revealed a complex tear of the lateral meniscus in the right knee.    Allergies No Known Drug Allergies.    Family History Congestive Heart Failure. father Diabetes Mellitus. mother and father Drug / Alcohol Addiction. father Heart disease in female family member before age 38 Hypertension. mother, father, sister, brother and grandmother mothers side Rheumatoid Arthritis. grandmother mothers side Severe allergy. sister and brother   Social History Alcohol use. current drinker; drinks wine; only occasionally per week Children. 1 Current work status. working full time Living situation. live alone Marital status. single Most recent primary occupation. Principal Tobacco use. never smoker   Medication History Labetalol HCl (200MG  Tablet, Oral) Active. Hydrochlorothiazide (25MG  Tablet, Oral) Active.   Past Surgical History Arthroscopy of Knee. right Sinus Surgery  Past Medical History Hypertension Migraine Headache   Review of Systems General:Not Present- Chills, Fever, Night Sweats, Appetite Loss, Fatigue, Feeling sick, Weight Gain and Weight Loss. Skin:Not Present- Itching, Rash, Skin Color Changes, Ulcer, Psoriasis and Change in Hair or Nails. HEENT:Not Present- Sensitivity to  light, Nose Bleed, Visual Loss, Decreased Hearing and Ringing in the Ears. Neck:Not Present- Swollen Glands and Neck Mass. Respiratory:Not Present- Shortness of breath, Snoring, Chronic Cough and Bloody sputum. Cardiovascular:Present- Leg Cramps. Not Present- Shortness of Breath, Chest Pain, Swelling of Extremities and Palpitations. Gastrointestinal:Not Present- Bloody Stool, Heartburn, Abdominal Pain, Vomiting, Nausea and Incontinence of Stool. Female Genitourinary:Not Present- Blood in Urine, Irregular/missing periods, Frequency, Incontinence and Nocturia. Musculoskeletal:Present- Muscle Weakness, Muscle Pain, Joint Stiffness, Joint Swelling and Joint Pain. Not Present- Back Pain. Neurological:Present- Tingling, Numbness and Burning. Not Present- Tremor, Headaches and Dizziness. Psychiatric:Not Present- Anxiety, Depression and Memory Loss. Endocrine:Not Present- Cold Intolerance, Heat Intolerance, Excessive hunger and Excessive Thirst. Hematology:Not Present- Abnormal Bleeding, Abnormal bruising, Anemia and Blood Clots.   Weight: 206 lb Height: 60 in Body Surface Area: 1.99 m Body Mass Index: 40.23 kg/m   Physical Exam On physical exam of her knee, she has a peculiar popping sensation with flexion and extension of her knee which could be consistent with a meniscal tear. Her patella tracks well in the midline. There is no evidence of any subluxation of the patella. Her cruciates and collateral ligaments are intact. Popliteal space is fine. Her calf is fine. Circulation is intact. No other abnormalities.Heart sounds normal. RRR. No murmurs. Lungs clear to ausculation. Abdomen soft and nontender. Bowel sounds active. Neck supple. EOM intact.     RADIOGRAPHS: AP of both knees and lateral of the right knee are unremarkable except for maybe early arthritic changes in the medial tibial plateau.   Assessment & Plan Lateral meniscus tear, right knee She needs a right  knee arthroscopy with alteral menisectomy.  At this point, the most predictable means of improving pain and function is knee arthroscopy. The procedure, risks, potential complications and rehab course  are discussed in detail and the patient elects to proceed. The goals of this procedure are decreased pain and increased function. There is a high liklihood that both of these goals will be achieved.     Dimitri Ped, PA-C

## 2012-02-27 ENCOUNTER — Encounter (HOSPITAL_BASED_OUTPATIENT_CLINIC_OR_DEPARTMENT_OTHER): Payer: Self-pay | Admitting: *Deleted

## 2012-02-27 NOTE — Progress Notes (Signed)
NPO AFTER MN. ARRIVES AT 1030. NEEDS ISTAT AND URINE PREG. WILL TAKE ZANTAC AM OF SURG W/ SIP OF SURG.

## 2012-03-02 ENCOUNTER — Ambulatory Visit (HOSPITAL_BASED_OUTPATIENT_CLINIC_OR_DEPARTMENT_OTHER)
Admission: RE | Admit: 2012-03-02 | Discharge: 2012-03-02 | Disposition: A | Discharge status: Home / Self Care | Payer: BC Managed Care – PPO | Source: Ambulatory Visit | Attending: Orthopedic Surgery | Admitting: Orthopedic Surgery

## 2012-03-02 ENCOUNTER — Ambulatory Visit (HOSPITAL_BASED_OUTPATIENT_CLINIC_OR_DEPARTMENT_OTHER): Payer: BC Managed Care – PPO | Admitting: Anesthesiology

## 2012-03-02 ENCOUNTER — Encounter (HOSPITAL_BASED_OUTPATIENT_CLINIC_OR_DEPARTMENT_OTHER): Payer: Self-pay | Admitting: *Deleted

## 2012-03-02 ENCOUNTER — Encounter (HOSPITAL_BASED_OUTPATIENT_CLINIC_OR_DEPARTMENT_OTHER)
Admission: RE | Disposition: A | Discharge status: Home / Self Care | Payer: Self-pay | Source: Ambulatory Visit | Attending: Orthopedic Surgery

## 2012-03-02 ENCOUNTER — Encounter (HOSPITAL_BASED_OUTPATIENT_CLINIC_OR_DEPARTMENT_OTHER): Payer: Self-pay | Admitting: Anesthesiology

## 2012-03-02 DIAGNOSIS — K219 Gastro-esophageal reflux disease without esophagitis: Secondary | ICD-10-CM | POA: Insufficient documentation

## 2012-03-02 DIAGNOSIS — S83289A Other tear of lateral meniscus, current injury, unspecified knee, initial encounter: Secondary | ICD-10-CM | POA: Insufficient documentation

## 2012-03-02 DIAGNOSIS — A6000 Herpesviral infection of urogenital system, unspecified: Secondary | ICD-10-CM

## 2012-03-02 DIAGNOSIS — M6580 Other synovitis and tenosynovitis, unspecified site: Secondary | ICD-10-CM | POA: Insufficient documentation

## 2012-03-02 DIAGNOSIS — M179 Osteoarthritis of knee, unspecified: Secondary | ICD-10-CM | POA: Diagnosis present

## 2012-03-02 DIAGNOSIS — M1710 Unilateral primary osteoarthritis, unspecified knee: Secondary | ICD-10-CM | POA: Insufficient documentation

## 2012-03-02 DIAGNOSIS — X58XXXA Exposure to other specified factors, initial encounter: Secondary | ICD-10-CM | POA: Insufficient documentation

## 2012-03-02 DIAGNOSIS — I10 Essential (primary) hypertension: Secondary | ICD-10-CM | POA: Insufficient documentation

## 2012-03-02 HISTORY — DX: Gastro-esophageal reflux disease without esophagitis: K21.9

## 2012-03-02 HISTORY — DX: Unspecified tear of unspecified meniscus, current injury, unspecified knee, initial encounter: S83.209A

## 2012-03-02 HISTORY — PX: KNEE ARTHROSCOPY WITH LATERAL MENISECTOMY: SHX6193

## 2012-03-02 HISTORY — DX: Other seasonal allergic rhinitis: J30.2

## 2012-03-02 HISTORY — DX: Migraine, unspecified, not intractable, without status migrainosus: G43.909

## 2012-03-02 HISTORY — DX: Essential (primary) hypertension: I10

## 2012-03-02 HISTORY — DX: Cardiac murmur, unspecified: R01.1

## 2012-03-02 LAB — POCT HEMOGLOBIN-HEMACUE: Hemoglobin: 12.4 g/dL (ref 12.0–15.0)

## 2012-03-02 LAB — POCT PREGNANCY, URINE: Preg Test, Ur: NEGATIVE

## 2012-03-02 SURGERY — ARTHROSCOPY, KNEE, WITH LATERAL MENISCECTOMY
Anesthesia: General | Site: Knee | Laterality: Right | Wound class: Clean

## 2012-03-02 MED ORDER — PROMETHAZINE HCL 25 MG/ML IJ SOLN
6.2500 mg | INTRAMUSCULAR | Status: DC | PRN
  Administered 2012-03-02: 6.25 mg via INTRAVENOUS
  Filled 2012-03-02: qty 1

## 2012-03-02 MED ORDER — LIDOCAINE HCL (CARDIAC) 20 MG/ML IV SOLN
INTRAVENOUS | Status: DC | PRN
  Administered 2012-03-02: 80 mg via INTRAVENOUS

## 2012-03-02 MED ORDER — OXYCODONE-ACETAMINOPHEN 10-650 MG PO TABS: 1.0000 | ORAL_TABLET | Freq: Four times a day (QID) | ORAL | Status: DC | PRN

## 2012-03-02 MED ORDER — FENTANYL CITRATE 0.05 MG/ML IJ SOLN
INTRAMUSCULAR | Status: DC | PRN
  Administered 2012-03-02 (×2): 25 ug via INTRAVENOUS
  Administered 2012-03-02: 50 ug via INTRAVENOUS
  Administered 2012-03-02 (×4): 25 ug via INTRAVENOUS

## 2012-03-02 MED ORDER — ONDANSETRON HCL 4 MG/2ML IJ SOLN
INTRAMUSCULAR | Status: DC | PRN
  Administered 2012-03-02: 4 mg via INTRAVENOUS

## 2012-03-02 MED ORDER — OXYCODONE-ACETAMINOPHEN 5-325 MG PO TABS
2.0000 | ORAL_TABLET | Freq: Four times a day (QID) | ORAL | Status: AC | PRN
  Administered 2012-03-02: 15:00:00 via ORAL
  Filled 2012-03-02: qty 2

## 2012-03-02 MED ORDER — PROPOFOL 10 MG/ML IV BOLUS
INTRAVENOUS | Status: DC | PRN
  Administered 2012-03-02: 200 mg via INTRAVENOUS
  Administered 2012-03-02: 150 mg via INTRAVENOUS

## 2012-03-02 MED ORDER — SODIUM CHLORIDE 0.9 % IR SOLN
Status: DC | PRN
  Administered 2012-03-02: 6000 mL

## 2012-03-02 MED ORDER — LACTATED RINGERS IV SOLN
INTRAVENOUS | Status: DC
  Administered 2012-03-02 (×3): via INTRAVENOUS
  Filled 2012-03-02: qty 1000

## 2012-03-02 MED ORDER — CEFAZOLIN SODIUM-DEXTROSE 2-3 GM-% IV SOLR
2.0000 g | INTRAVENOUS | Status: AC
  Administered 2012-03-02: 2 g via INTRAVENOUS
  Filled 2012-03-02: qty 50

## 2012-03-02 MED ORDER — LACTATED RINGERS IV SOLN
INTRAVENOUS | Status: DC
  Filled 2012-03-02: qty 1000

## 2012-03-02 MED ORDER — DEXAMETHASONE SODIUM PHOSPHATE 4 MG/ML IJ SOLN
INTRAMUSCULAR | Status: DC | PRN
  Administered 2012-03-02: 10 mg via INTRAVENOUS

## 2012-03-02 MED ORDER — CHLORHEXIDINE GLUCONATE 4 % EX LIQD
60.0000 mL | Freq: Once | CUTANEOUS | Status: DC
  Filled 2012-03-02: qty 60

## 2012-03-02 MED ORDER — FENTANYL CITRATE 0.05 MG/ML IJ SOLN
25.0000 ug | INTRAMUSCULAR | Status: DC | PRN
  Administered 2012-03-02 (×2): 25 ug via INTRAVENOUS
  Filled 2012-03-02: qty 1

## 2012-03-02 MED ORDER — BACITRACIN-NEOMYCIN-POLYMYXIN 400-5-5000 EX OINT
TOPICAL_OINTMENT | CUTANEOUS | Status: DC | PRN
  Administered 2012-03-02: 1 via TOPICAL

## 2012-03-02 MED ORDER — MIDAZOLAM HCL 5 MG/5ML IJ SOLN
INTRAMUSCULAR | Status: DC | PRN
  Administered 2012-03-02: 0.5 mg via INTRAVENOUS

## 2012-03-02 MED ORDER — BUPIVACAINE-EPINEPHRINE 0.25% -1:200000 IJ SOLN
INTRAMUSCULAR | Status: DC | PRN
  Administered 2012-03-02: 30 mL

## 2012-03-02 SURGICAL SUPPLY — 42 items
BANDAGE ELASTIC 4 VELCRO ST LF (GAUZE/BANDAGES/DRESSINGS) ×2 IMPLANT
BANDAGE ELASTIC 6 VELCRO ST LF (GAUZE/BANDAGES/DRESSINGS) ×2 IMPLANT
BLADE CUDA 5.5 (BLADE) IMPLANT
BLADE CUTTER GATOR 3.5 (BLADE) IMPLANT
BLADE CUTTER MENIS 5.5 (BLADE) IMPLANT
BLADE GREAT WHITE 4.2 (BLADE) ×2 IMPLANT
BLADE SURG 15 STRL LF DISP TIS (BLADE) IMPLANT
BLADE SURG 15 STRL SS (BLADE)
BNDG COHESIVE 6X5 TAN NS LF (GAUZE/BANDAGES/DRESSINGS) ×2 IMPLANT
CANISTER SUCT LVC 12 LTR MEDI- (MISCELLANEOUS) ×2 IMPLANT
CANISTER SUCTION 2500CC (MISCELLANEOUS) ×2 IMPLANT
CLOTH BEACON ORANGE TIMEOUT ST (SAFETY) ×2 IMPLANT
DRAPE ARTHROSCOPY W/POUCH 114 (DRAPES) ×2 IMPLANT
DRAPE LG THREE QUARTER DISP (DRAPES) ×2 IMPLANT
DRSG EMULSION OIL 3X3 NADH (GAUZE/BANDAGES/DRESSINGS) ×2 IMPLANT
DRSG PAD ABDOMINAL 8X10 ST (GAUZE/BANDAGES/DRESSINGS) ×2 IMPLANT
DURAPREP 26ML APPLICATOR (WOUND CARE) ×2 IMPLANT
ELECT MENISCUS 165MM 90D (ELECTRODE) IMPLANT
ELECT REM PT RETURN 9FT ADLT (ELECTROSURGICAL)
ELECTRODE REM PT RTRN 9FT ADLT (ELECTROSURGICAL) IMPLANT
GAUZE SPONGE 4X4 12PLY STRL LF (GAUZE/BANDAGES/DRESSINGS) ×2 IMPLANT
GLOVE ECLIPSE 8.0 STRL XLNG CF (GLOVE) ×2 IMPLANT
GLOVE INDICATOR 8.5 STRL (GLOVE) ×4 IMPLANT
GLOVE SURG ORTHO 6.0 STRL STRW (GLOVE) ×2 IMPLANT
GOWN PREVENTION PLUS LG XLONG (DISPOSABLE) ×2 IMPLANT
GOWN STRL REIN XL XLG (GOWN DISPOSABLE) ×2 IMPLANT
IV NS IRRIG 3000ML ARTHROMATIC (IV SOLUTION) ×4 IMPLANT
KNEE WRAP E Z 3 GEL PACK (MISCELLANEOUS) ×2 IMPLANT
MINI VAC (SURGICAL WAND) ×2 IMPLANT
PACK ARTHROSCOPY DSU (CUSTOM PROCEDURE TRAY) ×2 IMPLANT
PACK BASIN DAY SURGERY FS (CUSTOM PROCEDURE TRAY) ×2 IMPLANT
PADDING CAST COTTON 6X4 STRL (CAST SUPPLIES) ×2 IMPLANT
PENCIL BUTTON HOLSTER BLD 10FT (ELECTRODE) IMPLANT
SET ARTHROSCOPY TUBING (MISCELLANEOUS) ×1
SET ARTHROSCOPY TUBING LN (MISCELLANEOUS) ×1 IMPLANT
SPONGE GAUZE 4X4 12PLY (GAUZE/BANDAGES/DRESSINGS) ×2 IMPLANT
SPONGE SURGIFOAM ABS GEL 12-7 (HEMOSTASIS) IMPLANT
SUT ETHILON 3 0 PS 1 (SUTURE) ×2 IMPLANT
SYR CONTROL 10ML LL (SYRINGE) ×2 IMPLANT
TOWEL OR 17X24 6PK STRL BLUE (TOWEL DISPOSABLE) ×2 IMPLANT
WAND 90 DEG TURBOVAC W/CORD (SURGICAL WAND) ×2 IMPLANT
WATER STERILE IRR 500ML POUR (IV SOLUTION) ×2 IMPLANT

## 2012-03-02 NOTE — H&P (View-Only) (Signed)
NPO AFTER MN. ARRIVES AT 1030. NEEDS ISTAT AND URINE PREG. WILL TAKE ZANTAC AM OF SURG W/ SIP OF SURG. 

## 2012-03-02 NOTE — Anesthesia Postprocedure Evaluation (Signed)
Anesthesia Post Note  Patient: Amy Miller  Procedure(s) Performed: Procedure(s) (LRB): KNEE ARTHROSCOPY WITH LATERAL MENISECTOMY SYNOVECTOMY OF MEDIAL JOINT (Right)  Anesthesia type: General  Patient location: PACU  Post pain: Pain level controlled  Post assessment: Post-op Vital signs reviewed  Last Vitals: BP 102/42  Pulse 79  Temp(Src) 36.5 C (Oral)  Resp 16  Ht 5\' 1"  (1.549 m)  Wt 212 lb (96.163 kg)  BMI 40.08 kg/m2  SpO2 100%  LMP 02/25/2012  Post vital signs: Reviewed  Level of consciousness: sedated  Complications: No apparent anesthesia complications

## 2012-03-02 NOTE — Transfer of Care (Addendum)
Immediate Anesthesia Transfer of Care Note  Patient: Amy Miller  Procedure(s) Performed: Procedure(s) (LRB): KNEE ARTHROSCOPY WITH LATERAL MENISECTOMY SYNOVECTOMY OF MEDIAL JOINT (Right)  Patient Location: Patient transported to PACU with oxygen via face mask at 4 Liters / Min  Anesthesia Type: General  Level of Consciousness: awake and alert   Airway & Oxygen Therapy: Patient Spontanous Breathing and Patient connected to face mask oxygen  Post-op Assessment: Report given to PACU RN and Post -op Vital signs reviewed and stable  Post vital signs: Reviewed and stable  Dentition: Teeth and oropharynx remain in pre-op condition  Complications: No apparent anesthesia complications, Small cut on lip

## 2012-03-02 NOTE — Brief Op Note (Signed)
03/02/2012  1:43 PM  PATIENT:  Amy Miller  37 y.o. female  PRE-OPERATIVE DIAGNOSIS:  RIGHT KNEE LATERAL AND MEDIAL MENISCAL TEAR  POST-OPERATIVE DIAGNOSIS:  RIGHT KNEE LATERAL AND Synovitis Medially.  PROCEDURE:  Procedure(s) with comments: KNEE ARTHROSCOPY WITH LATERAL MENISECTOMY SYNOVECTOMY OF MEDIAL JOINT (Right) - AND POSSIBLE MEDIAL MENISECTOMY  SURGEON:  Surgeon(s) and Role:    * Jacki Cones, MD - Primary    ASSISTANTS: OR Tech  ANESTHESIA:   general  EBL:  Total I/O In: 1000 [I.V.:1000] Out: -   BLOOD ADMINISTERED:none  DRAINS: none   LOCAL MEDICATIONS USED:  MARCAINE  30cc of 0.25% with Epinephrine.   SPECIMEN:  No Specimen  DISPOSITION OF SPECIMEN:  N/A  COUNTS:  YES  TOURNIQUET:  * No tourniquets in log *  DICTATION: .Other Dictation: Dictation Number [id number]  PLAN OF CARE: Discharge to home after PACU  PATIENT DISPOSITION:  PACU - hemodynamically stable.   Delay start of Pharmacological VTE agent (>24hrs) due to surgical blood loss or risk of bleeding: yes

## 2012-03-02 NOTE — Anesthesia Preprocedure Evaluation (Signed)
Anesthesia Evaluation  Patient identified by MRN, date of birth, ID band Patient awake    Reviewed: Allergy & Precautions, H&P , NPO status , Patient's Chart, lab work & pertinent test results, reviewed documented beta blocker date and time   Airway Mallampati: II TM Distance: >3 FB Neck ROM: full    Dental no notable dental hx.    Pulmonary neg pulmonary ROS,  breath sounds clear to auscultation  Pulmonary exam normal       Cardiovascular Exercise Tolerance: Good hypertension, Pt. on medications and Pt. on home beta blockers Rhythm:regular Rate:Normal     Neuro/Psych negative neurological ROS  negative psych ROS   GI/Hepatic negative GI ROS, Neg liver ROS, GERD-  Controlled,  Endo/Other  negative endocrine ROS  Renal/GU negative Renal ROS  negative genitourinary   Musculoskeletal   Abdominal   Peds  Hematology negative hematology ROS (+)   Anesthesia Other Findings   Reproductive/Obstetrics negative OB ROS                           Anesthesia Physical Anesthesia Plan  ASA: II  Anesthesia Plan: General   Post-op Pain Management:    Induction: Intravenous  Airway Management Planned: LMA  Additional Equipment:   Intra-op Plan:   Post-operative Plan:   Informed Consent: I have reviewed the patients History and Physical, chart, labs and discussed the procedure including the risks, benefits and alternatives for the proposed anesthesia with the patient or authorized representative who has indicated his/her understanding and acceptance.   Dental Advisory Given  Plan Discussed with: CRNA and Surgeon  Anesthesia Plan Comments:         Anesthesia Quick Evaluation

## 2012-03-02 NOTE — Interval H&P Note (Signed)
History and Physical Interval Note:  03/02/2012 12:49 PM  Amy Miller  has presented today for surgery, with the diagnosis of RIGHT KNEE LATERAL AND MEDIAL MENISCAL TEAR  The various methods of treatment have been discussed with the patient and family. After consideration of risks, benefits and other options for treatment, the patient has consented to  Procedure(s) with comments: KNEE ARTHROSCOPY WITH LATERAL MENISECTOMY (Right) - AND POSSIBLE MEDIAL MENISECTOMY as a surgical intervention .  The patient's history has been reviewed, patient examined, no change in status, stable for surgery.  I have reviewed the patient's chart and labs.  Questions were answered to the patient's satisfaction.     Tameca Jerez A

## 2012-03-02 NOTE — Anesthesia Procedure Notes (Signed)
Procedure Name: LMA Insertion Date/Time: 03/02/2012 1:00 PM Performed by: Fran Lowes Pre-anesthesia Checklist: Patient identified, Emergency Drugs available, Suction available and Patient being monitored Patient Re-evaluated:Patient Re-evaluated prior to inductionOxygen Delivery Method: Circle System Utilized Preoxygenation: Pre-oxygenation with 100% oxygen Intubation Type: IV induction Ventilation: Mask ventilation without difficulty LMA: LMA inserted LMA Size: 4.0 Number of attempts: 1 Airway Equipment and Method: bite block Placement Confirmation: positive ETCO2 Tube secured with: Tape Dental Injury: Teeth and Oropharynx as per pre-operative assessment

## 2012-03-03 ENCOUNTER — Encounter (HOSPITAL_BASED_OUTPATIENT_CLINIC_OR_DEPARTMENT_OTHER): Payer: Self-pay | Admitting: Orthopedic Surgery

## 2012-03-03 NOTE — Op Note (Signed)
NAMEJULIAHNA, Amy Miller                 ACCOUNT NO.:  [id number]  MEDICAL RECORD NO.:  [id number]  LOCATION:                                 FACILITY:  PHYSICIAN:  Georges Lynch. Tayron Hunnell, M.D.DATE OF BIRTH:  05-22-1975  DATE OF PROCEDURE:  03/02/2012 DATE OF DISCHARGE:                              OPERATIVE REPORT   PREOPERATIVE DIAGNOSES: 1. Severe degenerative arthritis, lateral compartment, right knee. 2. Torn lateral meniscus, right knee. 3. Questionable tear of medial meniscus, right knee.  POSTOPERATIVE DIAGNOSES: 1. Severe degenerative arthritis, lateral compartment, right knee. 2. Torn lateral meniscus, right knee. 3. Questionable tear of medial meniscus, right knee expect there was     no medial meniscus tear.  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  OPERATION: 1. Diagnostic arthroscopy, right knee. 2. Lateral meniscectomy, right knee. 3. Synovectomy, medial joint, right knee.  PROCEDURE:  Under general anesthesia, routine orthopedic prep and draping of the right lower extremity carried out.  Appropriate time-out was carried out first, also marked the appropriate right leg in the holding area.  At this time, with the right knee in the knee holder, this lady had first had 2 g of IV Ancef.  We did do a sterile prep and draping, as I mentioned.  A small punctate incision made in suprapatellar pouch.  Inflow cannula was inserted.  Knee was distended with saline.  Another small punctate incision was made in the anterolateral joint.  The arthroscope was entered from the lateral approach.  I did a complete diagnostic arthroscopy.  She had some mild synovitis in the suprapatellar pouch.  I went down in the medial joint, probed the medial meniscus.  It was intact.  She did have a large amount of chronic synovitis tissue, did a synovectomy.  In the cruciates, she had a partial tear of the ACL.  The lateral joint was the main problem. She had complete absence  of articular cartilage of the lateral tibial plateau into distal femur laterally.  I initially was going to do a microfracture technique, but there was no reason for that as this has not helped her overall problem ever so much involvement.  The lateral meniscus was completely torn that is the remaining part that we said she had after her previous lateral meniscectomy for 5 years ago.  Thoroughly irrigated out the area, I did a lateral meniscectomy as well.  Cleaned the knee out and there was no other abnormalities.  I thoroughly irrigated out, the knee removed all fluid, closed all 3 punctate incisions with 3-0 nylon suture.  I did inject 30 mL of 0.25% Marcaine with epinephrine into the knee joint and a sterile Neosporin dressing was applied.  The patient left the operating room in satisfactory condition.  She will be on aspirin 325 mg b.i.d. starting today and for 2 weeks as an anticoagulant, and she will be on Percocet 10/250 one every 4 hours p.r.n. for pain.          ______________________________ Georges Lynch. Darrelyn Hillock, M.D.     RAG/MEDQ  D:  03/02/2012  T:  03/02/2012  Job:  [id number]

## 2012-03-05 NOTE — Progress Notes (Signed)
Attempted to leave message on voicemail-mailbox full

## 2012-06-22 ENCOUNTER — Other Ambulatory Visit: Payer: Self-pay | Admitting: Family Medicine

## 2012-06-22 NOTE — Telephone Encounter (Signed)
Requested Prescriptions   Pending Prescriptions Disp Refills  . hydrochlorothiazide (HYDRODIURIL) 25 MG tablet [Pharmacy Med Name: HYDROCHLOROTHIAZIDE 25MG  TAB] 30 tablet 0    Sig: TAKE ONE TABLET BY MOUTH EVERY DAY   Wyatt Haste, RN-BSN

## 2012-07-16 ENCOUNTER — Ambulatory Visit (INDEPENDENT_AMBULATORY_CARE_PROVIDER_SITE_OTHER): Payer: BC Managed Care – PPO | Admitting: Family Medicine

## 2012-07-16 ENCOUNTER — Encounter: Payer: Self-pay | Admitting: Family Medicine

## 2012-07-16 VITALS — BP 120/71 | HR 88 | Ht 60.0 in | Wt 213.0 lb

## 2012-07-16 DIAGNOSIS — Z3009 Encounter for other general counseling and advice on contraception: Secondary | ICD-10-CM

## 2012-07-16 DIAGNOSIS — R5383 Other fatigue: Secondary | ICD-10-CM

## 2012-07-16 DIAGNOSIS — R5381 Other malaise: Secondary | ICD-10-CM

## 2012-07-16 LAB — TSH: TSH: 0.596 u[IU]/mL (ref 0.350–4.500)

## 2012-07-16 LAB — CBC
MCH: 26.3 pg (ref 26.0–34.0)
MCHC: 32.6 g/dL (ref 30.0–36.0)
MCV: 80.5 fL (ref 78.0–100.0)
Platelets: 297 10*3/uL (ref 150–400)
RDW: 13.5 % (ref 11.5–15.5)

## 2012-07-16 NOTE — Progress Notes (Signed)
  Subjective:    Patient ID: Amy Miller, female    DOB: Aug 21, 1975, 37 y.o.   MRN: [id number]  HPI  Patient comes in complaining of fatigue.  She says it has been going on for a few months but is getting worse.  Has been "crashing" around 2 pm.  Says she sleeps OK, wakes up feeling rested, but has no energy in the afternoon.  She says she has no energy to do anything like exercise in the PM after work too. No blood in stool, night sweats, fevers, adenopathy, easy bruising or bleeding.   She also has questions about birth control.  Was bloated with depo and has difficulty taking pills.  Thinking about the patch but still unsure about remembering to change the patch.  Also concerned about increased risk of blood clots.  Read about "infection" with IUD and was not sure, but liked the idea of not having to worry about it for 5 years.   Review of Systems See HPI    Objective:   Physical Exam BP 120/71  Pulse 88  Ht 5' (1.524 m)  Wt 213 lb (96.616 kg)  BMI 41.6 kg/m2 General appearance: alert, cooperative and no distress Lungs: clear to auscultation bilaterally Heart: regular rate and rhythm, S1, S2 normal, no murmur, click, rub or gallop Extremities: extremities normal, atraumatic, no cyanosis or edema     Assessment & Plan:

## 2012-07-16 NOTE — Assessment & Plan Note (Signed)
Discussed low risk of infection with Mirena compared to very old IUD's.  Discussed increased risk for combined contraceptives including the patch in patient of her age and also with migraine headaches.  Showed model of mirena.  Patient decided she would like to have Mirena placed, will have her schedule procedure visit.

## 2012-07-16 NOTE — Patient Instructions (Signed)
It was good to see you.  I will send you a letter with your lab results, or call you if anything is abnormal.    Please make an appointment to have the Mirena placed.  I suggest taking ibuprofen, 3 over the counter tablets (600 mg) about 1 hour before your appointment.

## 2012-07-16 NOTE — Assessment & Plan Note (Signed)
No red flags, will check TSH, CBC, and vitamin D level.

## 2012-07-20 ENCOUNTER — Other Ambulatory Visit: Payer: Self-pay | Admitting: Family Medicine

## 2012-08-04 ENCOUNTER — Ambulatory Visit (INDEPENDENT_AMBULATORY_CARE_PROVIDER_SITE_OTHER): Payer: BC Managed Care – PPO | Admitting: Family Medicine

## 2012-08-04 ENCOUNTER — Encounter: Payer: Self-pay | Admitting: Family Medicine

## 2012-08-04 VITALS — BP 110/70 | HR 84 | Temp 98.9°F | Wt 213.0 lb

## 2012-08-04 DIAGNOSIS — Z309 Encounter for contraceptive management, unspecified: Secondary | ICD-10-CM

## 2012-08-04 DIAGNOSIS — Z3042 Encounter for surveillance of injectable contraceptive: Secondary | ICD-10-CM | POA: Insufficient documentation

## 2012-08-04 DIAGNOSIS — IMO0001 Reserved for inherently not codable concepts without codable children: Secondary | ICD-10-CM | POA: Insufficient documentation

## 2012-08-04 LAB — POCT URINE PREGNANCY: Preg Test, Ur: NEGATIVE

## 2012-08-04 MED ORDER — VITAMIN D (ERGOCALCIFEROL) 1.25 MG (50000 UNIT) PO CAPS: 50000.0000 [IU] | ORAL_CAPSULE | ORAL | Status: DC

## 2012-08-04 MED ORDER — MEDROXYPROGESTERONE ACETATE 150 MG/ML IM SUSP
150.0000 mg | Freq: Once | INTRAMUSCULAR | Status: AC
  Administered 2012-08-04: 150 mg via INTRAMUSCULAR

## 2012-08-04 NOTE — Progress Notes (Signed)
Patient ID: Amy Miller, female   DOB: 1975-09-02, 37 y.o.   MRN: [id number]  [facility] [name] M. Ashwin Tibbs, MD Phone: [phone]   Subjective: HPI: Patient is a 37 y.o. female presenting to clinic today for IUD insertion. Risks and benefits of IUD were discussed and patient would like to proceed. Pregnancy test has been collected and is negative.  IUD Insertion Procedure Note  Pre-operative Diagnosis: desire for long acting contraception  Post-operative Diagnosis: same, but failed insertion.  Indications: contraception  Procedure Details  Urine pregnancy test was done and result was negative.  The risks (including infection, bleeding, pain, and uterine perforation) and benefits of the procedure were explained to the patient and Written informed consent was obtained.    Cervix was difficult to visualize at 1:00 position. Cervix cleansed with Betadine and clamped with tenaculum to better visualize. Uterus could not be sounded due to cervical stenosis/scarring. Dr. Lum Babe also attempted to pass the sound but was unable to do so.  Condition: Stable, patient tolerated procedure well despite the multiple failed attempts  Plan: The patient was advised to call for any fever or for prolonged or severe pain or bleeding. She was advised to use NSAID as needed for mild to moderate pain.

## 2012-08-04 NOTE — Assessment & Plan Note (Signed)
Patient unable to have IUD due to cervical stenosis. Offered to have referral to OB to attempt again, but patient would like depo today and to think about other contraception. Given precautions for bleeding and pain today, as well as given information about all types of birth control. RTC within 3 months to discuss options.

## 2012-08-04 NOTE — Patient Instructions (Addendum)
I am so sorry we were not able to get your IUD placed today. Please think about other forms of birth control, and remember we can send you to [facility] to try the IUD again over there.  You should take ibuprofen for your pain, and you will likely see bleeding from the procedure today.  I will see you back within 3 months to discuss contraception.  Ragna Kramlich M. Aleza Pew, M.D.

## 2012-11-18 ENCOUNTER — Ambulatory Visit (INDEPENDENT_AMBULATORY_CARE_PROVIDER_SITE_OTHER): Payer: BC Managed Care – PPO | Admitting: *Deleted

## 2012-11-18 DIAGNOSIS — Z309 Encounter for contraceptive management, unspecified: Secondary | ICD-10-CM

## 2012-11-18 MED ORDER — MEDROXYPROGESTERONE ACETATE 150 MG/ML IM SUSP
150.0000 mg | Freq: Once | INTRAMUSCULAR | Status: AC
  Administered 2012-11-18: 150 mg via INTRAMUSCULAR

## 2012-11-18 NOTE — Progress Notes (Signed)
Patient in today for depo. Patient 2 weeks late so urine pregnancy test obtained with negative results. Injection given in left ventrogluteal, no complaints noted, site unremarkable. Next depo due January 14 - January 28, patient aware.

## 2012-12-15 ENCOUNTER — Other Ambulatory Visit: Payer: Self-pay | Admitting: Family Medicine

## 2012-12-21 ENCOUNTER — Other Ambulatory Visit: Payer: Self-pay | Admitting: Family Medicine

## 2012-12-21 MED ORDER — HYDROCHLOROTHIAZIDE 25 MG PO TABS: ORAL_TABLET | ORAL | Status: DC

## 2012-12-25 ENCOUNTER — Other Ambulatory Visit: Payer: Self-pay | Admitting: Family Medicine

## 2013-01-09 ENCOUNTER — Other Ambulatory Visit: Payer: Self-pay | Admitting: Family Medicine

## 2013-02-03 ENCOUNTER — Ambulatory Visit (INDEPENDENT_AMBULATORY_CARE_PROVIDER_SITE_OTHER): Payer: BC Managed Care – PPO | Admitting: Family Medicine

## 2013-02-03 VITALS — BP 111/76 | HR 90 | Temp 97.8°F | Wt 217.0 lb

## 2013-02-03 DIAGNOSIS — J0100 Acute maxillary sinusitis, unspecified: Secondary | ICD-10-CM

## 2013-02-03 MED ORDER — AZITHROMYCIN 250 MG PO TABS: ORAL_TABLET | ORAL | Status: DC

## 2013-02-03 NOTE — Progress Notes (Signed)
   Subjective:    Patient ID: Amy Miller, female    DOB: 07/26/1975, 38 y.o.   MRN: [id number]  Sinusitis This is a recurrent problem. The current episode started 1 to 4 weeks ago. There has been no fever. The fever has been present for 5 days or more. Associated symptoms include congestion, shortness of breath and sinus pressure. Pertinent negatives include no sneezing or sore throat. Past treatments include acetaminophen.  had positive flu treated and has been on Augmentin.  On intranasal steroid, Mucinex, Allegra with continued sinus pain and pressure.    Review of Systems  Constitutional: Negative for fever.  HENT: Positive for congestion and sinus pressure. Negative for sneezing and sore throat.   Respiratory: Positive for shortness of breath. Negative for wheezing.   Cardiovascular: Negative for chest pain.  Gastrointestinal: Negative for abdominal pain.       Objective:   Physical Exam  Vitals reviewed. Constitutional: She appears well-developed.  HENT:  Head: Normocephalic and atraumatic.  Right Ear: Tympanic membrane normal.  Left Ear: Tympanic membrane normal.  Nose: Mucosal edema and rhinorrhea present. Right sinus exhibits maxillary sinus tenderness. Left sinus exhibits maxillary sinus tenderness.  Mouth/Throat: Oropharynx is clear and moist.  Neck: Neck supple.  Cardiovascular: Normal rate.   Pulmonary/Chest: Effort normal.  Abdominal: Soft. There is no tenderness.          Assessment & Plan:

## 2013-02-03 NOTE — Patient Instructions (Signed)

## 2013-02-04 ENCOUNTER — Encounter: Payer: Self-pay | Admitting: Family Medicine

## 2013-02-04 NOTE — Assessment & Plan Note (Signed)
Trial of Z-pack--continue other supportive care meds as needed.

## 2013-03-27 ENCOUNTER — Other Ambulatory Visit: Payer: Self-pay | Admitting: Family Medicine

## 2013-04-05 ENCOUNTER — Ambulatory Visit (INDEPENDENT_AMBULATORY_CARE_PROVIDER_SITE_OTHER): Payer: BC Managed Care – PPO | Admitting: *Deleted

## 2013-04-05 DIAGNOSIS — Z309 Encounter for contraceptive management, unspecified: Secondary | ICD-10-CM

## 2013-04-05 LAB — POCT URINE PREGNANCY: PREG TEST UR: NEGATIVE

## 2013-04-05 MED ORDER — MEDROXYPROGESTERONE ACETATE 150 MG/ML IM SUSP
150.0000 mg | Freq: Once | INTRAMUSCULAR | Status: AC
  Administered 2013-04-05: 150 mg via INTRAMUSCULAR

## 2013-04-05 NOTE — Progress Notes (Signed)
   Pt late for Depo Provera injection.  Pregnancy test ordered, result negative.  Pt tolerated Depo injection. Depo given Left upper outer quadrant.  Next injection due June 2 -July 05, 2013.  Encouraged pt to take another pregnancy test in 2 weeks and to use another reliable form of birth control x 7 days.  Reminder card given. Clovis PuMartin, Tamika L, RN

## 2013-05-23 ENCOUNTER — Other Ambulatory Visit: Payer: Self-pay | Admitting: *Deleted

## 2013-05-23 ENCOUNTER — Encounter: Payer: Self-pay | Admitting: *Deleted

## 2013-05-23 MED ORDER — LABETALOL HCL 200 MG PO TABS: ORAL_TABLET | ORAL | Status: DC

## 2013-06-23 ENCOUNTER — Telehealth: Payer: Self-pay | Admitting: Family Medicine

## 2013-06-23 DIAGNOSIS — Z1239 Encounter for other screening for malignant neoplasm of breast: Secondary | ICD-10-CM

## 2013-06-23 NOTE — Telephone Encounter (Signed)
Order placed.  Kabria Hetzer M. Luceil Herrin, M.D.

## 2013-06-23 NOTE — Telephone Encounter (Signed)
Pt called and would like a referral to have a mammogram. jw

## 2013-06-24 NOTE — Telephone Encounter (Signed)
LMOVM for pt to call back.  Before I call and schedule this appt, would like to know if pt is having any pain or issue and if so where and how long. Princella Pellegrini Fleeger

## 2013-06-28 NOTE — Telephone Encounter (Signed)
Pt called back and read the message and said that she is not having any pains or issues. She just thought that since she is 38 it was time to have a mammogram. Amy Miller

## 2013-06-29 ENCOUNTER — Ambulatory Visit (INDEPENDENT_AMBULATORY_CARE_PROVIDER_SITE_OTHER): Payer: BC Managed Care – PPO | Admitting: *Deleted

## 2013-06-29 DIAGNOSIS — Z3042 Encounter for surveillance of injectable contraceptive: Secondary | ICD-10-CM

## 2013-06-29 DIAGNOSIS — Z3049 Encounter for surveillance of other contraceptives: Secondary | ICD-10-CM

## 2013-06-29 MED ORDER — MEDROXYPROGESTERONE ACETATE 150 MG/ML IM SUSP
150.0000 mg | Freq: Once | INTRAMUSCULAR | Status: AC
  Administered 2013-06-29: 150 mg via INTRAMUSCULAR

## 2013-06-29 NOTE — Telephone Encounter (Signed)
LM for patient to call back.  Please inform her since she just needs an annual mammogram she can call and schedule at her convenience.  [facility] number is [phone].  Thanks [employer]

## 2013-06-29 NOTE — Progress Notes (Signed)
  Pt in for Depo Provera injection.  Pt tolerated Depo injection. Depo given Right upper outer quadrant.  Next injection due August 26 - Sept 9, 2015.  Reminder card given. Clovis Pu, RN

## 2013-07-08 ENCOUNTER — Ambulatory Visit (HOSPITAL_COMMUNITY)
Admission: RE | Admit: 2013-07-08 | Discharge: 2013-07-08 | Disposition: A | Discharge status: Home / Self Care | Payer: BC Managed Care – PPO | Source: Ambulatory Visit | Attending: Family Medicine | Admitting: Family Medicine

## 2013-07-08 DIAGNOSIS — R928 Other abnormal and inconclusive findings on diagnostic imaging of breast: Secondary | ICD-10-CM | POA: Insufficient documentation

## 2013-07-08 DIAGNOSIS — Z1239 Encounter for other screening for malignant neoplasm of breast: Secondary | ICD-10-CM

## 2013-07-08 DIAGNOSIS — Z1231 Encounter for screening mammogram for malignant neoplasm of breast: Secondary | ICD-10-CM | POA: Insufficient documentation

## 2013-07-11 ENCOUNTER — Other Ambulatory Visit: Payer: Self-pay | Admitting: Family Medicine

## 2013-07-11 DIAGNOSIS — R928 Other abnormal and inconclusive findings on diagnostic imaging of breast: Secondary | ICD-10-CM

## 2013-07-14 ENCOUNTER — Other Ambulatory Visit: Payer: Self-pay

## 2013-07-14 ENCOUNTER — Other Ambulatory Visit: Payer: Self-pay | Admitting: Family Medicine

## 2013-07-14 DIAGNOSIS — R928 Other abnormal and inconclusive findings on diagnostic imaging of breast: Secondary | ICD-10-CM

## 2013-07-20 ENCOUNTER — Ambulatory Visit
Admission: RE | Admit: 2013-07-20 | Discharge: 2013-07-20 | Disposition: A | Discharge status: Home / Self Care | Payer: BC Managed Care – PPO | Source: Ambulatory Visit | Attending: Family Medicine | Admitting: Family Medicine

## 2013-07-20 DIAGNOSIS — R928 Other abnormal and inconclusive findings on diagnostic imaging of breast: Secondary | ICD-10-CM

## 2013-07-25 ENCOUNTER — Other Ambulatory Visit: Payer: Self-pay | Admitting: Family Medicine

## 2013-07-29 NOTE — Telephone Encounter (Signed)
This encounter was created in error - please disregard.

## 2013-08-01 MED ORDER — LABETALOL HCL 200 MG PO TABS: ORAL_TABLET | ORAL | Status: DC

## 2013-08-01 NOTE — Telephone Encounter (Signed)
Pt called because she needs a refill on her BP medication. She said the pharmacy has sent over three faxes. jw

## 2013-08-01 NOTE — Telephone Encounter (Signed)
Pt called, she has been out of medications x 1 week.  I gave her two month refill and advised to make appt. Fleeger, Maryjo RochesterJessica Dawn

## 2013-08-02 ENCOUNTER — Other Ambulatory Visit: Payer: Self-pay | Admitting: *Deleted

## 2013-08-02 MED ORDER — LABETALOL HCL 200 MG PO TABS: ORAL_TABLET | ORAL | Status: DC

## 2013-09-02 ENCOUNTER — Ambulatory Visit (INDEPENDENT_AMBULATORY_CARE_PROVIDER_SITE_OTHER): Payer: BC Managed Care – PPO | Admitting: Obstetrics and Gynecology

## 2013-09-02 ENCOUNTER — Encounter: Payer: Self-pay | Admitting: Obstetrics and Gynecology

## 2013-09-02 VITALS — BP 111/73 | HR 90 | Temp 98.7°F | Ht 60.0 in | Wt 216.3 lb

## 2013-09-02 DIAGNOSIS — I10 Essential (primary) hypertension: Secondary | ICD-10-CM | POA: Insufficient documentation

## 2013-09-02 DIAGNOSIS — O139 Gestational [pregnancy-induced] hypertension without significant proteinuria, unspecified trimester: Secondary | ICD-10-CM

## 2013-09-02 NOTE — Patient Instructions (Signed)
Ms. Moshe CiproBritt it was great to see you today!  I am pleased to hear that things are going well for you. Keep up the good work!  Please schedule an appointment with the staff up front at my next available slot so we can go ahead and do a physical. This will include lab work, pap smear, and preventative education.   Thanks for allowing me to be a part of your care! Dr. Doroteo GlassmanPhelps

## 2013-09-02 NOTE — Progress Notes (Signed)
Patient ID: Amy Miller Harkless, female   DOB: 06/07/1975, 38 y.o.   MRN: [id number]     Subjective:  Chief Complaint  Patient presents with  . meet new dr    HPI: Amy Miller Cotham is a 38 y.o. presenting to clinic today to meet her new doctor. She had no problems that she wanted to discuss at this visit.   HTN: patient states that her medications are well controlled on her current regimen of HCTZ and labetalol. She does not have any high or low blood pressure readings. She said she never had high blood pressures previously but during her pregnancy and after had high blood pressure.   Health Maintenance: Discussed need for Pap smear and blood work at next visit. Patient had a mammogram in June. Patient aware of flu season coming up and need for flu vaccine.   Objective: BP 111/73  Pulse 90  Temp(Src) 98.7 F (37.1 C) (Oral)  Ht 5' (1.524 m)  Wt 216 lb 4.8 oz (98.113 kg)  BMI 42.24 kg/m2 General: alert, well-developed, NAD, cooperative, A&Ox3 Head: normocephalic and atraumatic.  Eyes: vision grossly intact, no injection and anicteric.  Lungs: CTAB, normal respiratory effort, no accessory muscle use, no crackles, and no wheezes. Heart: RRR, no M/R/G.  Abdomen: soft, NT, ND, BS present and normoactive, no palpable masses Extremities: No cyanosis, clubbing, edema Neurologic: No neurologic focalization noticed on exam. Skin: turgor normal and no rashes.    Assessment: Amy Miller Velasques is a 38 y.o. female who presented to clinic for a new doctor visit. She is doing well today and has no acute problems.   Plan: 1. Discussed scheduling another visit soon to perform pap and blood work. 2. Patient to continue taking BP medications as they are controlling her pressures well 3. Future discussions on possibly switching birth controls   Caryl AdaJazma Phelps, DO 09/02/2013, 5:31 PM PGY-1, [facility]

## 2013-09-04 NOTE — Progress Notes (Signed)
Reviewed

## 2013-10-03 ENCOUNTER — Other Ambulatory Visit (HOSPITAL_COMMUNITY)
Admission: RE | Admit: 2013-10-03 | Discharge: 2013-10-03 | Disposition: A | Discharge status: Home / Self Care | Payer: BC Managed Care – PPO | Source: Ambulatory Visit | Attending: Family Medicine | Admitting: Family Medicine

## 2013-10-03 ENCOUNTER — Encounter: Payer: Self-pay | Admitting: Obstetrics and Gynecology

## 2013-10-03 ENCOUNTER — Ambulatory Visit (INDEPENDENT_AMBULATORY_CARE_PROVIDER_SITE_OTHER): Payer: BC Managed Care – PPO | Admitting: Obstetrics and Gynecology

## 2013-10-03 VITALS — BP 127/51 | HR 85 | Temp 98.5°F | Ht 60.0 in | Wt 220.6 lb

## 2013-10-03 DIAGNOSIS — Z23 Encounter for immunization: Secondary | ICD-10-CM | POA: Diagnosis not present

## 2013-10-03 DIAGNOSIS — R358 Other polyuria: Secondary | ICD-10-CM | POA: Diagnosis not present

## 2013-10-03 DIAGNOSIS — Z0000 Encounter for general adult medical examination without abnormal findings: Secondary | ICD-10-CM | POA: Diagnosis not present

## 2013-10-03 DIAGNOSIS — Z01419 Encounter for gynecological examination (general) (routine) without abnormal findings: Secondary | ICD-10-CM | POA: Diagnosis not present

## 2013-10-03 DIAGNOSIS — R3589 Other polyuria: Secondary | ICD-10-CM

## 2013-10-03 DIAGNOSIS — F32A Depression, unspecified: Secondary | ICD-10-CM

## 2013-10-03 DIAGNOSIS — F329 Major depressive disorder, single episode, unspecified: Secondary | ICD-10-CM

## 2013-10-03 DIAGNOSIS — F3289 Other specified depressive episodes: Secondary | ICD-10-CM

## 2013-10-03 DIAGNOSIS — Z124 Encounter for screening for malignant neoplasm of cervix: Secondary | ICD-10-CM

## 2013-10-03 DIAGNOSIS — Z309 Encounter for contraceptive management, unspecified: Secondary | ICD-10-CM | POA: Diagnosis not present

## 2013-10-03 DIAGNOSIS — Z1151 Encounter for screening for human papillomavirus (HPV): Secondary | ICD-10-CM | POA: Diagnosis present

## 2013-10-03 LAB — POCT URINALYSIS DIPSTICK
Blood, UA: NEGATIVE
Glucose, UA: NEGATIVE
Ketones, UA: NEGATIVE
LEUKOCYTES UA: NEGATIVE
Nitrite, UA: NEGATIVE
PH UA: 6
PROTEIN UA: 30
Spec Grav, UA: >=1.03
Urobilinogen, UA: 1

## 2013-10-03 LAB — POCT UA - MICROSCOPIC ONLY

## 2013-10-03 LAB — POCT GLYCOSYLATED HEMOGLOBIN (HGB A1C): HEMOGLOBIN A1C: 5.2

## 2013-10-03 LAB — POCT URINE PREGNANCY: PREG TEST UR: NEGATIVE

## 2013-10-03 MED ORDER — MEDROXYPROGESTERONE ACETATE 150 MG/ML IM SUSP
150.0000 mg | Freq: Once | INTRAMUSCULAR | Status: AC
  Administered 2013-10-03: 150 mg via INTRAMUSCULAR

## 2013-10-03 MED ORDER — SERTRALINE HCL 50 MG PO TABS: ORAL_TABLET | ORAL | Status: DC

## 2013-10-03 MED ORDER — HYDROCHLOROTHIAZIDE 25 MG PO TABS: ORAL_TABLET | ORAL | Status: DC

## 2013-10-03 MED ORDER — LABETALOL HCL 200 MG PO TABS: ORAL_TABLET | ORAL | Status: DC

## 2013-10-03 NOTE — Progress Notes (Signed)
Patient ID: Amy Miller, female   DOB: 20-Jun-1975, 38 y.o.   MRN: [id number]  Chief Complaint  Patient presents with  . Annual Exam  . Gynecologic Exam  . Polyuria   HPI: Pt presents for a well woman gynecological visit. Her only concern today is polyuria and polydipsia. She says this has increased over the past couple of weeks. She is concerned because both her mother and father have a history of diabetes. She denies any associated discharge or odor. Denies burning sensation.  ROS: denies fever, chills, dizziness, numbness or tingling in extremities, SOB, or chest pain.  Social History - Stress on the job.  She is a school principal. Never smoked. Endorses infrequent alcohol use.   Past Medical History  Diagnosis Date  . Hypertension   . Systolic murmur ASYMPTOMATIC--  ECHO IN EPIC--  EF 55-60%  . Acute meniscal tear of knee RIGHT  . GERD (gastroesophageal reflux disease)   . Migraine   . Seasonal allergies    Family History  Problem Relation Age of Onset  . Diabetes Mother   . Hyperlipidemia Mother   . Hypertension Mother   . Diabetes Father   . Hypertension Father   . Hyperlipidemia Father   . Heart disease Father   . Hypertension Sister   . Hypertension Brother     No Known Allergies  Current Outpatient Prescriptions on File Prior to Visit  Medication Sig Dispense Refill  . acyclovir (ZOVIRAX) 400 MG tablet Take 1 tablet (400 mg total) by mouth 3 (three) times daily.  15 tablet  3  . azithromycin (ZITHROMAX) 250 MG tablet 2 po on day 1, then 1 po daily x 4 days  6 tablet  0  . butalbital-acetaminophen-caffeine (FIORICET, ESGIC) 50-325-40 MG per tablet Take 1-2 tablets by mouth every 4 (four) hours as needed. for migraine headache, no more than 6 tabs per day.      . cetirizine (ZYRTEC) 10 MG tablet Take 10 mg by mouth daily.       . fexofenadine (ALLEGRA) 60 MG tablet Take 60 mg by mouth as needed for allergies or rhinitis.      . fluticasone (FLONASE) 50 MCG/ACT nasal  spray Place 2 sprays into the nose daily.       . meloxicam (MOBIC) 15 MG tablet Take 15 mg by mouth as needed.      Marland Kitchen oxyCODONE-acetaminophen (PERCOCET) 10-650 MG per tablet Take 1 tablet by mouth every 6 (six) hours as needed for pain.  40 tablet  0  . ranitidine (ZANTAC) 150 MG tablet Take 150 mg by mouth as needed.       . traMADol (ULTRAM) 50 MG tablet Take 1 tablet (50 mg total) by mouth every 8 (eight) hours as needed for pain.  30 tablet  1  . Vitamin D, Ergocalciferol, (DRISDOL) 50000 UNITS CAPS Take 1 capsule (50,000 Units total) by mouth every 7 (seven) days.  12 capsule  0  . zolpidem (AMBIEN) 5 MG tablet Take 1 tablet (5 mg total) by mouth at bedtime as needed for sleep.  30 tablet  0   No current facility-administered medications on file prior to visit.    PHYSICAL EXAM: BP 127/51  Pulse 85  Temp(Src) 98.5 F (36.9 C) (Oral)  Ht 5' (1.524 m)  Wt 220 lb 9.6 oz (100.064 kg)  BMI 43.08 kg/m2 Gen: NAD, pleasant, cooperative, obese HEENT: NCAT, MMM, EOMI Neck: No thyromegaly. Supple Heart: RRR, no murmurs appreciated. Lungs: CTAB, NWOB Abdomen: soft,  nontender to palpation, +BS, ND, no masses palpated GU: normal appearing external genitalia with some old herpetic scars. Vagina is moist with thin white discharge. Cervix normal in appearance. No cervical motion tenderness or tenderness on bimanual exam. No adnexal masses.  Neuro: grossly nonfocal, speech normal, gait normal  ASSESSMENT/PLAN: See problem based assessment and plan.  # Health maintenance: flu shot given today. Mammogram done in July of this year.   FOLLOW UP: F/u in one year for routine physical and preventative care.  Caryl Ada, DO 10/03/2013, 9:00 AM PGY-1, [facility]

## 2013-10-03 NOTE — Assessment & Plan Note (Signed)
Patient doing well on Zoloft. She says that it does help her. Patient educated on decreasing stress and symptoms of depression. Refill given today.

## 2013-10-03 NOTE — Assessment & Plan Note (Signed)
Patient received Depo injection today. POCT urine pregnancy test negative. Next injection is due between November 30 through December 14.

## 2013-10-03 NOTE — Patient Instructions (Addendum)
[name] great to see you today!  I am pleased to hear that things are going well for you.  Here are some of the things we discussed today: -Decreasing stress and eating healthy with exercise will be beneficial for your health - flu shot given today -I refilled your medications -I will contact you with the results of your pap and blood work.  You received your Depo shot today. Your next injection will be due November 30th.   Please schedule a follow-up appointment for for next year. You can always come in as needed for any concerns.  Thanks for allowing me to be a part of your care! Dr. Gerarda Fraction       Preventive Care for Adults A healthy lifestyle and preventive care can promote health and wellness. Preventive health guidelines for women include the following key practices.  A routine yearly physical is a good way to check with your health care provider about your health and preventive screening. It is a chance to share any concerns and updates on your health and to receive a thorough exam.  Visit your dentist for a routine exam and preventive care every 6 months. Brush your teeth twice a day and floss once a day. Good oral hygiene prevents tooth decay and gum disease.  The frequency of eye exams is based on your age, health, family medical history, use of contact lenses, and other factors. Follow your health care provider's recommendations for frequency of eye exams.  Eat a healthy diet. Foods like vegetables, fruits, whole grains, low-fat dairy products, and lean protein foods contain the nutrients you need without too many calories. Decrease your intake of foods high in solid fats, added sugars, and salt. Eat the right amount of calories for you.Get information about a proper diet from your health care provider, if necessary.  Regular physical exercise is one of the most important things you can do for your health. Most adults should get at least 150 minutes of moderate-intensity  exercise (any activity that increases your heart rate and causes you to sweat) each week. In addition, most adults need muscle-strengthening exercises on 2 or more days a week.  Maintain a healthy weight. The body mass index (BMI) is a screening tool to identify possible weight problems. It provides an estimate of body fat based on height and weight. Your health care provider can find your BMI and can help you achieve or maintain a healthy weight.For adults 20 years and older:  A BMI below 18.5 is considered underweight.  A BMI of 18.5 to 24.9 is normal.  A BMI of 25 to 29.9 is considered overweight.  A BMI of 30 and above is considered obese.  Maintain normal blood lipids and cholesterol levels by exercising and minimizing your intake of saturated fat. Eat a balanced diet with plenty of fruit and vegetables. Blood tests for lipids and cholesterol should begin at age 71 and be repeated every 5 years. If your lipid or cholesterol levels are high, you are over 50, or you are at high risk for heart disease, you may need your cholesterol levels checked more frequently.Ongoing high lipid and cholesterol levels should be treated with medicines if diet and exercise are not working.  If you smoke, find out from your health care provider how to quit. If you do not use tobacco, do not start.  Lung cancer screening is recommended for adults aged 65-80 years who are at high risk for developing lung cancer because of a history  of smoking. A yearly low-dose CT scan of the lungs is recommended for people who have at least a 30-pack-year history of smoking and are a current smoker or have quit within the past 15 years. A pack year of smoking is smoking an average of 1 pack of cigarettes a day for 1 year (for example: 1 pack a day for 30 years or 2 packs a day for 15 years). Yearly screening should continue until the smoker has stopped smoking for at least 15 years. Yearly screening should be stopped for people who  develop a health problem that would prevent them from having lung cancer treatment.  If you are pregnant, do not drink alcohol. If you are breastfeeding, be very cautious about drinking alcohol. If you are not pregnant and choose to drink alcohol, do not have more than 1 drink per day. One drink is considered to be 12 ounces (355 mL) of beer, 5 ounces (148 mL) of wine, or 1.5 ounces (44 mL) of liquor.  Avoid use of street drugs. Do not share needles with anyone. Ask for help if you need support or instructions about stopping the use of drugs.  High blood pressure causes heart disease and increases the risk of stroke. Your blood pressure should be checked at least every 1 to 2 years. Ongoing high blood pressure should be treated with medicines if weight loss and exercise do not work.  If you are 72-74 years old, ask your health care provider if you should take aspirin to prevent strokes.  Diabetes screening involves taking a blood sample to check your fasting blood sugar level. This should be done once every 3 years, after age 57, if you are within normal weight and without risk factors for diabetes. Testing should be considered at a younger age or be carried out more frequently if you are overweight and have at least 1 risk factor for diabetes.  Breast cancer screening is essential preventive care for women. You should practice "breast self-awareness." This means understanding the normal appearance and feel of your breasts and may include breast self-examination. Any changes detected, no matter how small, should be reported to a health care provider. Women in their 20s and 30s should have a clinical breast exam (CBE) by a health care provider as part of a regular health exam every 1 to 3 years. After age 84, women should have a CBE every year. Starting at age 48, women should consider having a mammogram (breast X-ray test) every year. Women who have a family history of breast cancer should talk to their  health care provider about genetic screening. Women at a high risk of breast cancer should talk to their health care providers about having an MRI and a mammogram every year.  Breast cancer gene (BRCA)-related cancer risk assessment is recommended for women who have family members with BRCA-related cancers. BRCA-related cancers include breast, ovarian, tubal, and peritoneal cancers. Having family members with these cancers may be associated with an increased risk for harmful changes (mutations) in the breast cancer genes BRCA1 and BRCA2. Results of the assessment will determine the need for genetic counseling and BRCA1 and BRCA2 testing.  Routine pelvic exams to screen for cancer are no longer recommended for nonpregnant women who are considered low risk for cancer of the pelvic organs (ovaries, uterus, and vagina) and who do not have symptoms. Ask your health care provider if a screening pelvic exam is right for you.  If you have had past treatment for cervical  cancer or a condition that could lead to cancer, you need Pap tests and screening for cancer for at least 20 years after your treatment. If Pap tests have been discontinued, your risk factors (such as having a new sexual partner) need to be reassessed to determine if screening should be resumed. Some women have medical problems that increase the chance of getting cervical cancer. In these cases, your health care provider may recommend more frequent screening and Pap tests.  The HPV test is an additional test that may be used for cervical cancer screening. The HPV test looks for the virus that can cause the cell changes on the cervix. The cells collected during the Pap test can be tested for HPV. The HPV test could be used to screen women aged 77 years and older, and should be used in women of any age who have unclear Pap test results. After the age of 49, women should have HPV testing at the same frequency as a Pap test.  Colorectal cancer can be  detected and often prevented. Most routine colorectal cancer screening begins at the age of 11 years and continues through age 32 years. However, your health care provider may recommend screening at an earlier age if you have risk factors for colon cancer. On a yearly basis, your health care provider may provide home test kits to check for hidden blood in the stool. Use of a small camera at the end of a tube, to directly examine the colon (sigmoidoscopy or colonoscopy), can detect the earliest forms of colorectal cancer. Talk to your health care provider about this at age 45, when routine screening begins. Direct exam of the colon should be repeated every 5-10 years through age 41 years, unless early forms of pre-cancerous polyps or small growths are found.  People who are at an increased risk for hepatitis B should be screened for this virus. You are considered at high risk for hepatitis B if:  You were born in a country where hepatitis B occurs often. Talk with your health care provider about which countries are considered high risk.  Your parents were born in a high-risk country and you have not received a shot to protect against hepatitis B (hepatitis B vaccine).  You have HIV or AIDS.  You use needles to inject street drugs.  You live with, or have sex with, someone who has hepatitis B.  You get hemodialysis treatment.  You take certain medicines for conditions like cancer, organ transplantation, and autoimmune conditions.  Hepatitis C blood testing is recommended for all people born from [birth year] through 1965 and any individual with known risks for hepatitis C.  Practice safe sex. Use condoms and avoid high-risk sexual practices to reduce the spread of sexually transmitted infections (STIs). STIs include gonorrhea, chlamydia, syphilis, trichomonas, herpes, HPV, and human immunodeficiency virus (HIV). Herpes, HIV, and HPV are viral illnesses that have no cure. They can result in disability,  cancer, and death.  You should be screened for sexually transmitted illnesses (STIs) including gonorrhea and chlamydia if:  You are sexually active and are younger than 24 years.  You are older than 24 years and your health care provider tells you that you are at risk for this type of infection.  Your sexual activity has changed since you were last screened and you are at an increased risk for chlamydia or gonorrhea. Ask your health care provider if you are at risk.  If you are at risk of being infected with HIV,  it is recommended that you take a prescription medicine daily to prevent HIV infection. This is called preexposure prophylaxis (PrEP). You are considered at risk if:  You are a heterosexual woman, are sexually active, and are at increased risk for HIV infection.  You take drugs by injection.  You are sexually active with a partner who has HIV.  Talk with your health care provider about whether you are at high risk of being infected with HIV. If you choose to begin PrEP, you should first be tested for HIV. You should then be tested every 3 months for as long as you are taking PrEP.  Osteoporosis is a disease in which the bones lose minerals and strength with aging. This can result in serious bone fractures or breaks. The risk of osteoporosis can be identified using a bone density scan. Women ages 38 years and over and women at risk for fractures or osteoporosis should discuss screening with their health care providers. Ask your health care provider whether you should take a calcium supplement or vitamin D to reduce the rate of osteoporosis.  Menopause can be associated with physical symptoms and risks. Hormone replacement therapy is available to decrease symptoms and risks. You should talk to your health care provider about whether hormone replacement therapy is right for you.  Use sunscreen. Apply sunscreen liberally and repeatedly throughout the day. You should seek shade when your  shadow is shorter than you. Protect yourself by wearing long sleeves, pants, a wide-brimmed hat, and sunglasses year round, whenever you are outdoors.  Once a month, do a whole body skin exam, using a mirror to look at the skin on your back. Tell your health care provider of new moles, moles that have irregular borders, moles that are larger than a pencil eraser, or moles that have changed in shape or color.  Stay current with required vaccines (immunizations).  Influenza vaccine. All adults should be immunized every year.  Tetanus, diphtheria, and acellular pertussis (Td, Tdap) vaccine. Pregnant women should receive 1 dose of Tdap vaccine during each pregnancy. The dose should be obtained regardless of the length of time since the last dose. Immunization is preferred during the 27th-36th week of gestation. An adult who has not previously received Tdap or who does not know her vaccine status should receive 1 dose of Tdap. This initial dose should be followed by tetanus and diphtheria toxoids (Td) booster doses every 10 years. Adults with an unknown or incomplete history of completing a 3-dose immunization series with Td-containing vaccines should begin or complete a primary immunization series including a Tdap dose. Adults should receive a Td booster every 10 years.  Varicella vaccine. An adult without evidence of immunity to varicella should receive 2 doses or a second dose if she has previously received 1 dose. Pregnant females who do not have evidence of immunity should receive the first dose after pregnancy. This first dose should be obtained before leaving the health care facility. The second dose should be obtained 4-8 weeks after the first dose.  Human papillomavirus (HPV) vaccine. Females aged 13-26 years who have not received the vaccine previously should obtain the 3-dose series. The vaccine is not recommended for use in pregnant females. However, pregnancy testing is not needed before  receiving a dose. If a female is found to be pregnant after receiving a dose, no treatment is needed. In that case, the remaining doses should be delayed until after the pregnancy. Immunization is recommended for any person with an immunocompromised  condition through the age of 50 years if she did not get any or all doses earlier. During the 3-dose series, the second dose should be obtained 4-8 weeks after the first dose. The third dose should be obtained 24 weeks after the first dose and 16 weeks after the second dose.  Zoster vaccine. One dose is recommended for adults aged 38 years or older unless certain conditions are present.  Measles, mumps, and rubella (MMR) vaccine. Adults born before [birth year] generally are considered immune to measles and mumps. Adults born in [birth year] or later should have 1 or more doses of MMR vaccine unless there is a contraindication to the vaccine or there is laboratory evidence of immunity to each of the three diseases. A routine second dose of MMR vaccine should be obtained at least 28 days after the first dose for students attending postsecondary schools, health care workers, or international travelers. People who received inactivated measles vaccine or an unknown type of measles vaccine during 1963-1967 should receive 2 doses of MMR vaccine. People who received inactivated mumps vaccine or an unknown type of mumps vaccine before 1979 and are at high risk for mumps infection should consider immunization with 2 doses of MMR vaccine. For females of childbearing age, rubella immunity should be determined. If there is no evidence of immunity, females who are not pregnant should be vaccinated. If there is no evidence of immunity, females who are pregnant should delay immunization until after pregnancy. Unvaccinated health care workers born before [birth year] who lack laboratory evidence of measles, mumps, or rubella immunity or laboratory confirmation of disease should consider measles and mumps  immunization with 2 doses of MMR vaccine or rubella immunization with 1 dose of MMR vaccine.  Pneumococcal 13-valent conjugate (PCV13) vaccine. When indicated, a person who is uncertain of her immunization history and has no record of immunization should receive the PCV13 vaccine. An adult aged 53 years or older who has certain medical conditions and has not been previously immunized should receive 1 dose of PCV13 vaccine. This PCV13 should be followed with a dose of pneumococcal polysaccharide (PPSV23) vaccine. The PPSV23 vaccine dose should be obtained at least 8 weeks after the dose of PCV13 vaccine. An adult aged 49 years or older who has certain medical conditions and previously received 1 or more doses of PPSV23 vaccine should receive 1 dose of PCV13. The PCV13 vaccine dose should be obtained 1 or more years after the last PPSV23 vaccine dose.  Pneumococcal polysaccharide (PPSV23) vaccine. When PCV13 is also indicated, PCV13 should be obtained first. All adults aged 30 years and older should be immunized. An adult younger than age 55 years who has certain medical conditions should be immunized. Any person who resides in a nursing home or long-term care facility should be immunized. An adult smoker should be immunized. People with an immunocompromised condition and certain other conditions should receive both PCV13 and PPSV23 vaccines. People with human immunodeficiency virus (HIV) infection should be immunized as soon as possible after diagnosis. Immunization during chemotherapy or radiation therapy should be avoided. Routine use of PPSV23 vaccine is not recommended for American Indians, [place] Natives, or people younger than 65 years unless there are medical conditions that require PPSV23 vaccine. When indicated, people who have unknown immunization and have no record of immunization should receive PPSV23 vaccine. One-time revaccination 5 years after the first dose of PPSV23 is recommended for people aged  19-64 years who have chronic kidney failure, nephrotic syndrome, asplenia, or immunocompromised conditions. People  who received 1-2 doses of PPSV23 before age 31 years should receive another dose of PPSV23 vaccine at age 3 years or later if at least 5 years have passed since the previous dose. Doses of PPSV23 are not needed for people immunized with PPSV23 at or after age 79 years.  Meningococcal vaccine. Adults with asplenia or persistent complement component deficiencies should receive 2 doses of quadrivalent meningococcal conjugate (MenACWY-D) vaccine. The doses should be obtained at least 2 months apart. Microbiologists working with certain meningococcal bacteria, [employer] recruits, people at risk during an outbreak, and people who travel to or live in countries with a high rate of meningitis should be immunized. A first-year college student up through age 85 years who is living in a residence hall should receive a dose if she did not receive a dose on or after her 16th birthday. Adults who have certain high-risk conditions should receive one or more doses of vaccine.  Hepatitis A vaccine. Adults who wish to be protected from this disease, have certain high-risk conditions, work with hepatitis A-infected animals, work in hepatitis A research labs, or travel to or work in countries with a high rate of hepatitis A should be immunized. Adults who were previously unvaccinated and who anticipate close contact with an international adoptee during the first 60 days after arrival in the [place] from a country with a high rate of hepatitis A should be immunized.  Hepatitis B vaccine. Adults who wish to be protected from this disease, have certain high-risk conditions, may be exposed to blood or other infectious body fluids, are household contacts or sex partners of hepatitis B positive people, are clients or workers in certain care facilities, or travel to or work in countries with a high rate of hepatitis B  should be immunized.  Haemophilus influenzae type b (Hib) vaccine. A previously unvaccinated person with asplenia or sickle cell disease or having a scheduled splenectomy should receive 1 dose of Hib vaccine. Regardless of previous immunization, a recipient of a hematopoietic stem cell transplant should receive a 3-dose series 6-12 months after her successful transplant. Hib vaccine is not recommended for adults with HIV infection. Preventive Services / Frequency Ages 75 to 26 years  Blood pressure check.** / Every 1 to 2 years.  Lipid and cholesterol check.** / Every 5 years beginning at age 35.  Clinical breast exam.** / Every 3 years for women in their 11s and 67s.  BRCA-related cancer risk assessment.** / For women who have family members with a BRCA-related cancer (breast, ovarian, tubal, or peritoneal cancers).  Pap test.** / Every 2 years from ages 72 through 51. Every 3 years starting at age 5 through age 36 or 35 with a history of 3 consecutive normal Pap tests.  HPV screening.** / Every 3 years from ages 60 through ages 64 to 27 with a history of 3 consecutive normal Pap tests.  Hepatitis C blood test.** / For any individual with known risks for hepatitis C.  Skin self-exam. / Monthly.  Influenza vaccine. / Every year.  Tetanus, diphtheria, and acellular pertussis (Tdap, Td) vaccine.** / Consult your health care provider. Pregnant women should receive 1 dose of Tdap vaccine during each pregnancy. 1 dose of Td every 10 years.  Varicella vaccine.** / Consult your health care provider. Pregnant females who do not have evidence of immunity should receive the first dose after pregnancy.  HPV vaccine. / 3 doses over 6 months, if 37 and younger. The vaccine is not recommended for  use in pregnant females. However, pregnancy testing is not needed before receiving a dose.  Measles, mumps, rubella (MMR) vaccine.** / You need at least 1 dose of MMR if you were born in 1957 or later. You  may also need a 2nd dose. For females of childbearing age, rubella immunity should be determined. If there is no evidence of immunity, females who are not pregnant should be vaccinated. If there is no evidence of immunity, females who are pregnant should delay immunization until after pregnancy.  Pneumococcal 13-valent conjugate (PCV13) vaccine.** / Consult your health care provider.  Pneumococcal polysaccharide (PPSV23) vaccine.** / 1 to 2 doses if you smoke cigarettes or if you have certain conditions.  Meningococcal vaccine.** / 1 dose if you are age 49 to 88 years and a Market researcher living in a residence hall, or have one of several medical conditions, you need to get vaccinated against meningococcal disease. You may also need additional booster doses.  Hepatitis A vaccine.** / Consult your health care provider.  Hepatitis B vaccine.** / Consult your health care provider.  Haemophilus influenzae type b (Hib) vaccine.** / Consult your health care provider. Ages 60 to 27 years  Blood pressure check.** / Every 1 to 2 years.  Lipid and cholesterol check.** / Every 5 years beginning at age 17 years.  Lung cancer screening. / Every year if you are aged 48-80 years and have a 30-pack-year history of smoking and currently smoke or have quit within the past 15 years. Yearly screening is stopped once you have quit smoking for at least 15 years or develop a health problem that would prevent you from having lung cancer treatment.  Clinical breast exam.** / Every year after age 4 years.  BRCA-related cancer risk assessment.** / For women who have family members with a BRCA-related cancer (breast, ovarian, tubal, or peritoneal cancers).  Mammogram.** / Every year beginning at age 64 years and continuing for as long as you are in good health. Consult with your health care provider.  Pap test.** / Every 3 years starting at age 46 years through age 61 or 71 years with a history of 3  consecutive normal Pap tests.  HPV screening.** / Every 3 years from ages 48 years through ages 73 to 65 years with a history of 3 consecutive normal Pap tests.  Fecal occult blood test (FOBT) of stool. / Every year beginning at age 1 years and continuing until age 62 years. You may not need to do this test if you get a colonoscopy every 10 years.  Flexible sigmoidoscopy or colonoscopy.** / Every 5 years for a flexible sigmoidoscopy or every 10 years for a colonoscopy beginning at age 70 years and continuing until age 39 years.  Hepatitis C blood test.** / For all people born from [birth year] through 1965 and any individual with known risks for hepatitis C.  Skin self-exam. / Monthly.  Influenza vaccine. / Every year.  Tetanus, diphtheria, and acellular pertussis (Tdap/Td) vaccine.** / Consult your health care provider. Pregnant women should receive 1 dose of Tdap vaccine during each pregnancy. 1 dose of Td every 10 years.  Varicella vaccine.** / Consult your health care provider. Pregnant females who do not have evidence of immunity should receive the first dose after pregnancy.  Zoster vaccine.** / 1 dose for adults aged 72 years or older.  Measles, mumps, rubella (MMR) vaccine.** / You need at least 1 dose of MMR if you were born in 1957 or later. You may also  need a 2nd dose. For females of childbearing age, rubella immunity should be determined. If there is no evidence of immunity, females who are not pregnant should be vaccinated. If there is no evidence of immunity, females who are pregnant should delay immunization until after pregnancy.  Pneumococcal 13-valent conjugate (PCV13) vaccine.** / Consult your health care provider.  Pneumococcal polysaccharide (PPSV23) vaccine.** / 1 to 2 doses if you smoke cigarettes or if you have certain conditions.  Meningococcal vaccine.** / Consult your health care provider.  Hepatitis A vaccine.** / Consult your health care provider.  Hepatitis B  vaccine.** / Consult your health care provider.  Haemophilus influenzae type b (Hib) vaccine.** / Consult your health care provider. Ages 41 years and over  Blood pressure check.** / Every 1 to 2 years.  Lipid and cholesterol check.** / Every 5 years beginning at age 11 years.  Lung cancer screening. / Every year if you are aged 39-80 years and have a 30-pack-year history of smoking and currently smoke or have quit within the past 15 years. Yearly screening is stopped once you have quit smoking for at least 15 years or develop a health problem that would prevent you from having lung cancer treatment.  Clinical breast exam.** / Every year after age 58 years.  BRCA-related cancer risk assessment.** / For women who have family members with a BRCA-related cancer (breast, ovarian, tubal, or peritoneal cancers).  Mammogram.** / Every year beginning at age 44 years and continuing for as long as you are in good health. Consult with your health care provider.  Pap test.** / Every 3 years starting at age 39 years through age 48 or 26 years with 3 consecutive normal Pap tests. Testing can be stopped between 65 and 70 years with 3 consecutive normal Pap tests and no abnormal Pap or HPV tests in the past 10 years.  HPV screening.** / Every 3 years from ages 82 years through ages 60 or 75 years with a history of 3 consecutive normal Pap tests. Testing can be stopped between 65 and 70 years with 3 consecutive normal Pap tests and no abnormal Pap or HPV tests in the past 10 years.  Fecal occult blood test (FOBT) of stool. / Every year beginning at age 16 years and continuing until age 82 years. You may not need to do this test if you get a colonoscopy every 10 years.  Flexible sigmoidoscopy or colonoscopy.** / Every 5 years for a flexible sigmoidoscopy or every 10 years for a colonoscopy beginning at age 72 years and continuing until age 69 years.  Hepatitis C blood test.** / For all people born from [birth year]  through 1965 and any individual with known risks for hepatitis C.  Osteoporosis screening.** / A one-time screening for women ages 35 years and over and women at risk for fractures or osteoporosis.  Skin self-exam. / Monthly.  Influenza vaccine. / Every year.  Tetanus, diphtheria, and acellular pertussis (Tdap/Td) vaccine.** / 1 dose of Td every 10 years.  Varicella vaccine.** / Consult your health care provider.  Zoster vaccine.** / 1 dose for adults aged 63 years or older.  Pneumococcal 13-valent conjugate (PCV13) vaccine.** / Consult your health care provider.  Pneumococcal polysaccharide (PPSV23) vaccine.** / 1 dose for all adults aged 65 years and older.  Meningococcal vaccine.** / Consult your health care provider.  Hepatitis A vaccine.** / Consult your health care provider.  Hepatitis B vaccine.** / Consult your health care provider.  Haemophilus influenzae type b (Hib)  vaccine.** / Consult your health care provider. ** Family history and personal history of risk and conditions may change your health care provider's recommendations. Document Released: 03/04/2001 Document Revised: 05/23/2013 Document Reviewed: 06/03/2010 [facility] Patient Information 2015 [place], [place]. This information is not intended to replace advice given to you by your health care provider. Make sure you discuss any questions you have with your health care provider.

## 2013-10-03 NOTE — Assessment & Plan Note (Signed)
Will monitor at this time. Hemoglobin A1c ordered. Patient has family history of diabetes in mother and father. Dipstick urinalysis showed no signs of infection or leakage of glucose into urine.

## 2013-10-03 NOTE — Addendum Note (Signed)
Addended by: Jimmy Footman K on: 10/03/2013 10:24 AM   Modules accepted: Orders

## 2013-10-03 NOTE — Assessment & Plan Note (Signed)
A: Well-appearing, obese, 38yo female.  P: flu shot given today. Fasting lipid panel and A1C with CBG ordered. Pt educated on healthy eating, exercise, and need to decrease stress.

## 2013-10-04 LAB — LIPID PANEL
CHOL/HDL RATIO: 3.6 ratio
Cholesterol: 173 mg/dL (ref 0–200)
HDL: 48 mg/dL (ref >39)
LDL CALC: 105 mg/dL — AB (ref 0–99)
Triglycerides: 99 mg/dL (ref <150)
VLDL: 20 mg/dL (ref 0–40)

## 2013-10-04 LAB — GLUCOSE, FASTING: Glucose, Fasting: 89 mg/dL (ref 70–99)

## 2013-10-05 LAB — CYTOLOGY - PAP

## 2013-12-23 ENCOUNTER — Ambulatory Visit (INDEPENDENT_AMBULATORY_CARE_PROVIDER_SITE_OTHER): Payer: BC Managed Care – PPO | Admitting: *Deleted

## 2013-12-23 ENCOUNTER — Telehealth: Payer: Self-pay | Admitting: *Deleted

## 2013-12-23 DIAGNOSIS — Z308 Encounter for other contraceptive management: Secondary | ICD-10-CM | POA: Diagnosis not present

## 2013-12-23 MED ORDER — MEDROXYPROGESTERONE ACETATE 150 MG/ML IM SUSP
150.0000 mg | Freq: Once | INTRAMUSCULAR | Status: AC
  Administered 2013-12-23: 150 mg via INTRAMUSCULAR

## 2013-12-23 NOTE — Progress Notes (Signed)
Pt here today for a depo injection.  Last one was 10/03/2013 and she was due 12/19/13 - 09/23/13.  Depo given in right upper outer quadrant and pt made aware that she is due for next injection 03/10/14 - 03/25/14.  Mykiah Schmuck, Maryjo RochesterJessica Dawn

## 2013-12-23 NOTE — Telephone Encounter (Signed)
Left voice message for pt to return nurse call to reschedule Depo Provera appt.  Nurse leaving today at 4 PM.  Clovis PuMartin, Ernestyne Caldwell L, RN

## 2014-01-05 ENCOUNTER — Ambulatory Visit (INDEPENDENT_AMBULATORY_CARE_PROVIDER_SITE_OTHER): Payer: BC Managed Care – PPO | Admitting: Obstetrics and Gynecology

## 2014-01-05 ENCOUNTER — Encounter: Payer: Self-pay | Admitting: Obstetrics and Gynecology

## 2014-01-05 VITALS — BP 121/73 | HR 88 | Temp 98.3°F | Ht 60.0 in | Wt 224.0 lb

## 2014-01-05 DIAGNOSIS — G43719 Chronic migraine without aura, intractable, without status migrainosus: Secondary | ICD-10-CM | POA: Diagnosis not present

## 2014-01-05 MED ORDER — PROPRANOLOL HCL 10 MG PO TABS: 10.0000 mg | ORAL_TABLET | Freq: Two times a day (BID) | ORAL | Status: DC

## 2014-01-05 NOTE — Patient Instructions (Signed)

## 2014-01-05 NOTE — Progress Notes (Signed)
   Subjective:    Patient ID: Amy Miller, female    DOB: 05/24/1975, 38 y.o.   MRN: [id number]  HPI Comments: Migraine headaches for the last 10 years. Patient requesting letter to give to insurance company stating that massage therapy would be beneficial to patient.   Migraine  This is a chronic problem. The current episode started more than 1 year ago. The problem occurs daily. The problem has been unchanged. The pain is located in the retro-orbital and occipital region. The pain does not radiate. The pain quality is similar to prior headaches (Patient states there is a difference between her regular headaches and her migraines.). The quality of the pain is described as pulsating and throbbing. The pain is moderate. Associated symptoms include blurred vision, eye pain, muscle aches, nausea, neck pain, phonophobia and photophobia. The symptoms are aggravated by bright light and emotional stress. She has tried NSAIDs, triptans and darkened room (Patient states that sometimes she has to lay down because the pain is so bad. Sleep helps the migraine go away at times. Massage therapy that patient pays for also help release pain. The longest stretch she has had of continuous migraine is 2-3 days. ) for the symptoms. The treatment provided mild relief. Her past medical history is significant for migraine headaches and obesity.    Review of Systems  Eyes: Positive for blurred vision, photophobia and pain.  Gastrointestinal: Positive for nausea.  Musculoskeletal: Positive for neck pain.       Objective:   Physical Exam  Constitutional: She is oriented to person, place, and time. She appears well-developed and well-nourished. No distress.  HENT:  Head: Normocephalic and atraumatic.  Mouth/Throat: Oropharynx is clear and moist.  Eyes: Conjunctivae and EOM are normal. Pupils are equal, round, and reactive to light. No scleral icterus.  Neck:  Neck tightness appreciated.  Cardiovascular: Normal rate,  regular rhythm, normal heart sounds and intact distal pulses.   Pulmonary/Chest: Effort normal and breath sounds normal. She has no wheezes. She has no rales.  Abdominal: Soft. Bowel sounds are normal. There is no tenderness.  Musculoskeletal: Normal range of motion. She exhibits no edema.  Lymphadenopathy:    She has no cervical adenopathy.  Neurological: She is alert and oriented to person, place, and time. She has normal reflexes. No cranial nerve deficit.  Skin: Skin is warm and dry.  Psychiatric: She has a normal mood and affect.       Assessment & Plan:      Caryl AdaJazma Phelps, DO 01/05/2014, 9:57 PM PGY-1, [facility]

## 2014-01-07 DIAGNOSIS — G43909 Migraine, unspecified, not intractable, without status migrainosus: Secondary | ICD-10-CM | POA: Insufficient documentation

## 2014-01-07 NOTE — Assessment & Plan Note (Signed)
A: Chronic migraines for past 10 years. Also seems like a tension headache component as stress is a large component.  P: Letter given to patient to help her massage therapy get covered by insurance. Patient also given a Rx for BB to help with migraine prophylaxis. She can continue using NSAID to help with pain. Patient will need to monitor her BP and HR on new medication. Patient should return to clinic in a month.

## 2014-04-26 ENCOUNTER — Ambulatory Visit (INDEPENDENT_AMBULATORY_CARE_PROVIDER_SITE_OTHER): Payer: BC Managed Care – PPO | Admitting: *Deleted

## 2014-04-26 DIAGNOSIS — Z3042 Encounter for surveillance of injectable contraceptive: Secondary | ICD-10-CM

## 2014-04-26 LAB — POCT URINE PREGNANCY: Preg Test, Ur: NEGATIVE

## 2014-04-26 MED ORDER — MEDROXYPROGESTERONE ACETATE 150 MG/ML IM SUSP
150.0000 mg | Freq: Once | INTRAMUSCULAR | Status: AC
  Administered 2014-04-26: 150 mg via INTRAMUSCULAR

## 2014-04-26 NOTE — Progress Notes (Signed)
   Pt late for Depo Provera injection. Pregnancy test ordered; results negative.  Pt stated last sex was after Christmas.  Pt advised to use a condom for the next 7-10 days.  Pt tolerated Depo injection. Depo given left upper outer quadrant.  Next injection due Jun 22-July 26, 2014.  Reminder card given. Clovis PuMartin, Tamika L, RN

## 2014-07-14 ENCOUNTER — Other Ambulatory Visit (HOSPITAL_COMMUNITY)
Admission: RE | Admit: 2014-07-14 | Discharge: 2014-07-14 | Disposition: A | Discharge status: Home / Self Care | Payer: BC Managed Care – PPO | Source: Ambulatory Visit | Attending: Family Medicine | Admitting: Family Medicine

## 2014-07-14 ENCOUNTER — Ambulatory Visit (INDEPENDENT_AMBULATORY_CARE_PROVIDER_SITE_OTHER): Payer: BC Managed Care – PPO | Admitting: Family Medicine

## 2014-07-14 VITALS — BP 121/64 | HR 78 | Temp 98.5°F | Wt 225.1 lb

## 2014-07-14 DIAGNOSIS — B373 Candidiasis of vulva and vagina: Secondary | ICD-10-CM | POA: Diagnosis not present

## 2014-07-14 DIAGNOSIS — Z3042 Encounter for surveillance of injectable contraceptive: Secondary | ICD-10-CM | POA: Diagnosis not present

## 2014-07-14 DIAGNOSIS — Z113 Encounter for screening for infections with a predominantly sexual mode of transmission: Secondary | ICD-10-CM | POA: Insufficient documentation

## 2014-07-14 DIAGNOSIS — N760 Acute vaginitis: Secondary | ICD-10-CM | POA: Diagnosis not present

## 2014-07-14 DIAGNOSIS — B3731 Acute candidiasis of vulva and vagina: Secondary | ICD-10-CM

## 2014-07-14 LAB — POCT WET PREP (WET MOUNT): Clue Cells Wet Prep Whiff POC: NEGATIVE

## 2014-07-14 MED ORDER — FLUCONAZOLE 150 MG PO TABS: 150.0000 mg | ORAL_TABLET | Freq: Once | ORAL | Status: DC

## 2014-07-14 MED ORDER — MEDROXYPROGESTERONE ACETATE 150 MG/ML IM SUSP
150.0000 mg | Freq: Once | INTRAMUSCULAR | Status: AC
  Administered 2014-07-14: 150 mg via INTRAMUSCULAR

## 2014-07-14 NOTE — Progress Notes (Signed)
Patient ID: Amy Miller, female   DOB: February 03, 1975, 39 y.o.   MRN: [id number] Subjective:   CC: Vaginal irritation  HPI:   Patient presents for SDA eval for vaginal itching and irritation for 2 months, thought due to diet/stress but did not go away over time. No pain in vagina except mild burning when she urinates (she thinks due to scratching). No visible lesions/rash. Not currently having sex so unable to comment on dyspareunia. No abdominal pain, back pain, dysuria, nausea, vomiting, fevers, chills, change in PO. No recent antibiotics. No abnormal vaginal discharge or odor. No concern for pregnancy or new STD.  Review of Systems - Per HPI. She is also due for depo shot today.  PMH - genital herpes hx, morbid obesity, polyuria hx SH: Reports not currently being sexually active    Objective:  Physical Exam BP 121/64 mmHg  Pulse 78  Temp(Src) 98.5 F (36.9 C) (Oral)  Wt 225 lb 1.6 oz (102.105 kg) GEN: NAD, pleasant GU: External genitalia with erythema and hypopigmentation with mildly raised areas and satellite lesions at labia, small amount on mons, and moderately in intertriginous inguinal creases Vaginal mucosa and cervix normal in appearance, but on palpation cervix is stenotic. No CMT, no uterine or adnexal abnormal size or mobility or mass No ulceration visualized Abd: S/NT/ND with no suprapubic tenderness    Assessment:     Amy Miller is a 39 y.o. female here for vaginal irritation.    Plan:     # See problem list and after visit summary for problem-specific plans.   # Health Maintenance: Depo shot today and return in 3 months for next. Pt states not sexually active since Dec and declined UPT.    Follow-up: Follow up in 1-2 weeks PRN if itching is not resolved.   Leona Singleton, MD [facility]

## 2014-07-14 NOTE — Assessment & Plan Note (Signed)
Yeast vaginitis is most likely given exam and symptoms in otherwise well-appearing female. No fevers, chills, abdominal pain, or abnormal discharge to raise concern for STD per patient not sexually active in 6 months. No herpetic lesion. -Empiric diflucan PO x 1 -Wet prep, GC/Chlamydia -F/u in 1-2 weeks if no resolution to consider skin biopsy, though likely will not need.

## 2014-07-14 NOTE — Patient Instructions (Signed)
Take diflucan as prescribed. If symptoms are not significantly improving in 1-2 weeks, come back. You got a depo shot today.  Best,  Leona Singleton, MD

## 2014-07-17 ENCOUNTER — Telehealth: Payer: Self-pay | Admitting: Family Medicine

## 2014-07-17 LAB — CERVICOVAGINAL ANCILLARY ONLY
CHLAMYDIA, DNA PROBE: NEGATIVE
NEISSERIA GONORRHEA: NEGATIVE

## 2014-07-17 NOTE — Telephone Encounter (Signed)
Left message on voice mail. When patient calls, please notify her that wet prep was positive for yeast (as we suspected) and GC/chlamydia were negative. The treatment for yeast is fluconazole which I had already prescribed.   Leona Singleton, MD

## 2014-09-11 ENCOUNTER — Other Ambulatory Visit: Payer: Self-pay | Admitting: Obstetrics and Gynecology

## 2014-09-11 NOTE — Telephone Encounter (Signed)
Needs a refill on her acyclovir because of a "flare up". Needs it sent to [facility] on [address]. Sadie Reynolds, ASA

## 2014-09-12 NOTE — Telephone Encounter (Signed)
Patient has not had refill of this medication in 4 years. If she is having an active outbreak and needs medicine then I will give her a short course to overcome it.

## 2014-09-13 MED ORDER — ACYCLOVIR 400 MG PO TABS: 400.0000 mg | ORAL_TABLET | Freq: Three times a day (TID) | ORAL | Status: DC

## 2014-10-11 ENCOUNTER — Ambulatory Visit (INDEPENDENT_AMBULATORY_CARE_PROVIDER_SITE_OTHER): Payer: BC Managed Care – PPO | Admitting: *Deleted

## 2014-10-11 DIAGNOSIS — Z3042 Encounter for surveillance of injectable contraceptive: Secondary | ICD-10-CM | POA: Diagnosis not present

## 2014-10-11 DIAGNOSIS — Z23 Encounter for immunization: Secondary | ICD-10-CM | POA: Diagnosis not present

## 2014-10-11 MED ORDER — MEDROXYPROGESTERONE ACETATE 150 MG/ML IM SUSP
150.0000 mg | Freq: Once | INTRAMUSCULAR | Status: AC
  Administered 2014-10-11: 150 mg via INTRAMUSCULAR

## 2014-10-11 NOTE — Progress Notes (Signed)
   Pt in for Depo Provera injection.  Pt tolerated Depo injection. Depo given left upper outer quadrant.  Next injection due Dec. 7-Dec. 21, 2016.  Reminder card given. Clovis Pu, RN

## 2014-10-15 ENCOUNTER — Other Ambulatory Visit: Payer: Self-pay | Admitting: Obstetrics and Gynecology

## 2014-11-24 ENCOUNTER — Other Ambulatory Visit: Payer: Self-pay | Admitting: Obstetrics and Gynecology

## 2015-01-18 ENCOUNTER — Ambulatory Visit: Payer: BC Managed Care – PPO

## 2015-01-25 ENCOUNTER — Ambulatory Visit (INDEPENDENT_AMBULATORY_CARE_PROVIDER_SITE_OTHER): Payer: BC Managed Care – PPO | Admitting: *Deleted

## 2015-01-25 DIAGNOSIS — Z3042 Encounter for surveillance of injectable contraceptive: Secondary | ICD-10-CM | POA: Diagnosis not present

## 2015-01-25 LAB — POCT URINE PREGNANCY: Preg Test, Ur: NEGATIVE

## 2015-01-25 MED ORDER — MEDROXYPROGESTERONE ACETATE 150 MG/ML IM SUSP
150.0000 mg | Freq: Once | INTRAMUSCULAR | Status: AC
  Administered 2015-01-25: 150 mg via INTRAMUSCULAR

## 2015-01-25 NOTE — Progress Notes (Signed)
     Patient presents for Depo Provera injection States feeling well Last injection received 10/11/2014 Patient is overdue for injection. Urine pregnancy negative Depo Provera given IM RUOQ. Patient tolerated well. Patient states it has been "over a year" since last had unprotected sex Patient aware to use second method of birth control such as condoms and spermicide for next 7 days Next injection due Mar 23- April 26, 2015 Reminder card given Fredderick SeveranceUCATTE, Saharah Sherrow L, RN

## 2015-01-26 ENCOUNTER — Ambulatory Visit: Payer: BC Managed Care – PPO

## 2015-02-25 ENCOUNTER — Other Ambulatory Visit: Payer: Self-pay | Admitting: Obstetrics and Gynecology

## 2015-03-12 ENCOUNTER — Encounter: Payer: Self-pay | Admitting: Obstetrics and Gynecology

## 2015-03-12 ENCOUNTER — Ambulatory Visit (INDEPENDENT_AMBULATORY_CARE_PROVIDER_SITE_OTHER): Payer: BC Managed Care – PPO | Admitting: Obstetrics and Gynecology

## 2015-03-12 VITALS — BP 122/50 | HR 86 | Temp 98.6°F | Wt 233.7 lb

## 2015-03-12 DIAGNOSIS — M25561 Pain in right knee: Secondary | ICD-10-CM

## 2015-03-12 DIAGNOSIS — I10 Essential (primary) hypertension: Secondary | ICD-10-CM | POA: Diagnosis not present

## 2015-03-12 DIAGNOSIS — R5382 Chronic fatigue, unspecified: Secondary | ICD-10-CM | POA: Diagnosis not present

## 2015-03-12 MED ORDER — ACYCLOVIR 400 MG PO TABS: 400.0000 mg | ORAL_TABLET | Freq: Three times a day (TID) | ORAL | Status: DC | PRN

## 2015-03-12 MED ORDER — LABETALOL HCL 100 MG PO TABS: 100.0000 mg | ORAL_TABLET | Freq: Two times a day (BID) | ORAL | Status: DC

## 2015-03-12 NOTE — Assessment & Plan Note (Signed)
Continues to have right knee pain. Intermittent in nature. Does not interfere with daily activities. Denies any new injuries. Knee exam today unremarkable. Prior diagnosis of osteoarthritis right knee. Also has had history of bucket tear to knee and status post 2 surgeries. Patient will follow-up with [facility] orthopedics. Will place referral if needed. Continue prn pain relief medications.

## 2015-03-12 NOTE — Patient Instructions (Addendum)
I am pleased to hear that things are going well for you.  Here are some of the things we discussed today: -BP well controlled today. Stop taking HCTZ.  -We can discuss switching off labetalol if you want at a later time -if fatigue last another 2 months come in for complete work-up -follow-up with [place] for knee pain and can place referral if needed -Refills made  New medications: None   Please schedule a follow-up appointment as needed or in 3 months   Thanks for allowing me to be a part of your care! Dr. Doroteo Glassman

## 2015-03-12 NOTE — Assessment & Plan Note (Signed)
Fatigue is chronic in nature. Has been an issue for the last 3 years. No red flags on exam. Prior lab workup in 2014 all within normal limits. 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 Discussed with patient as she is not better in the next 2 months to come back in for further evaluation. Advised on stress reduction and sleep hygiene.

## 2015-03-12 NOTE — Assessment & Plan Note (Addendum)
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 She was started on labetalol at that time and continues to use. She is unwilling to switch to a better agent for blood pressure control. Today blood pressures are within normal limits. She intermittently takes HCTZ with her labetalol but states that labetalol she takes daily. With normal to soft blood pressure today will decrease labetalol to 100 mg twice a day. Discontinue the HCTZ. Patient will continue to monitor blood pressures at home and follow-up if not well controlled on lower dose.

## 2015-03-12 NOTE — Progress Notes (Signed)
    Subjective: Chief Complaint  Patient presents with  . Follow-up    Hypertension     HPI: Amy Miller is a 40 y.o. presenting to clinic today to discuss the following:  #Hypertension: Blood pressure at home: Checks and is well controlled. No reported high pressures.  Blood pressure today: wnl Taking Meds: Yes just labetalol and intermittently HCTZ Side effects: None ROS: Denies headache, dizziness, visual changes, nausea, vomiting, chest pain, abdominal pain or shortness of breath.  #Knee pain: -Right knee pain -has had 2 surgeries on knee in past for bucket tear -denies any new trauma to knee -follows with [place] orthopedics -discomfort not limiting mobility  #Fatigue: -history of chronic fatigue -has been fatigued and with low energy for the last 6-8 months -does suffer from depression -believes her fatigue is seasonal because feels better in the warmer months -has increased stress from job  Health Maintenance: Up to Date   ROS noted in HPI.  Past Medical, Surgical, Social, and Family History Reviewed & Updated per EMR.   Objective: BP 122/50 mmHg  Pulse 86  Temp(Src) 98.6 F (37 C) (Oral)  Wt 233 lb 11.2 oz (106.006 kg) Vitals and nursing notes reviewed  Physical Exam  Constitutional: She is well-developed, well-nourished, and in no distress.  Neck: Normal range of motion. Neck supple. No thyromegaly present.  Cardiovascular: Normal rate and regular rhythm.   Pulmonary/Chest: Effort normal and breath sounds normal.  Musculoskeletal: Normal range of motion. She exhibits no edema or tenderness.  Neurological: She is alert.  Skin: Skin is warm and dry. No rash noted. No erythema.    Assessment/Plan: Please see problem based Assessment and Plan   Meds ordered this encounter  Medications  . acyclovir (ZOVIRAX) 400 MG tablet    Sig: Take 1 tablet (400 mg total) by mouth 3 (three) times daily as needed (for flare). Total of 5 days per episode.   Dispense:  20 tablet    Refill:  0  . labetalol (NORMODYNE) 100 MG tablet    Sig: Take 1 tablet (100 mg total) by mouth 2 (two) times daily.    Dispense:  60 tablet    Refill:  0     Caryl Ada, DO 03/12/2015, 2:50 PM PGY-2, [facility]

## 2015-04-18 ENCOUNTER — Ambulatory Visit (INDEPENDENT_AMBULATORY_CARE_PROVIDER_SITE_OTHER): Payer: BC Managed Care – PPO | Admitting: *Deleted

## 2015-04-18 DIAGNOSIS — Z3042 Encounter for surveillance of injectable contraceptive: Secondary | ICD-10-CM

## 2015-04-18 MED ORDER — MEDROXYPROGESTERONE ACETATE 150 MG/ML IM SUSP
150.0000 mg | Freq: Once | INTRAMUSCULAR | Status: AC
  Administered 2015-04-18: 150 mg via INTRAMUSCULAR

## 2015-04-18 NOTE — Progress Notes (Signed)
   Pt in for Depo Provera injection.  Pt tolerated Depo injection. Depo given left upper outer quadrant.  Next injection due June 14-July 18, 2015.  Reminder card given. Clovis PuMartin, Tamika L, RN

## 2015-04-25 ENCOUNTER — Other Ambulatory Visit: Payer: Self-pay | Admitting: Obstetrics and Gynecology

## 2015-04-26 ENCOUNTER — Telehealth: Payer: Self-pay | Admitting: Obstetrics and Gynecology

## 2015-04-26 NOTE — Telephone Encounter (Signed)
Please inform patient that medication changes like that just can't be made without patient being evaluated. When I saw her 2 months ago I had already reduced the dose of her medication and she was supposed to follow-up. If she wants a change she needs to be seen in clinic to be evaluated to see if pressures are controlled or not on most recent changes. Also I do not refill certain things for a year supply unless I know patient is really stable on that medication. Just refilled her for 2 months so she can come see me before that refill runs out or sooner(but likely with a different provider).

## 2015-04-26 NOTE — Telephone Encounter (Signed)
Patient would like to have her rx for the labetalol changed to once daily with more that 1 refill for each request.  Would like to have refills given for years worth.  Please resend for update to pharmacy.

## 2015-04-30 NOTE — Telephone Encounter (Signed)
lmovm for pt to return call. Fleeger, Jessica Dawn, CMA  

## 2015-07-03 ENCOUNTER — Other Ambulatory Visit: Payer: Self-pay | Admitting: Obstetrics and Gynecology

## 2015-07-05 ENCOUNTER — Ambulatory Visit: Payer: BC Managed Care – PPO

## 2015-07-13 ENCOUNTER — Ambulatory Visit (INDEPENDENT_AMBULATORY_CARE_PROVIDER_SITE_OTHER): Payer: BC Managed Care – PPO | Admitting: *Deleted

## 2015-07-13 DIAGNOSIS — Z3042 Encounter for surveillance of injectable contraceptive: Secondary | ICD-10-CM

## 2015-07-13 MED ORDER — MEDROXYPROGESTERONE ACETATE 150 MG/ML IM SUSY
150.0000 mg | PREFILLED_SYRINGE | Freq: Once | INTRAMUSCULAR | Status: AC
  Administered 2015-07-13: 150 mg via INTRAMUSCULAR

## 2015-07-13 NOTE — Progress Notes (Signed)
   Pt in for Depo Provera injection.  Pt tolerated Depo injection. Depo given right upper outer quadrant.  Next injection due Sept 8-22, 2017.  Reminder card given. Clovis PuMartin, Tamika L, RN

## 2015-08-14 ENCOUNTER — Other Ambulatory Visit: Payer: Self-pay | Admitting: Obstetrics and Gynecology

## 2015-09-25 DIAGNOSIS — R0683 Snoring: Secondary | ICD-10-CM | POA: Insufficient documentation

## 2015-10-28 ENCOUNTER — Other Ambulatory Visit: Payer: Self-pay | Admitting: Obstetrics and Gynecology

## 2015-11-14 ENCOUNTER — Ambulatory Visit (INDEPENDENT_AMBULATORY_CARE_PROVIDER_SITE_OTHER): Payer: BC Managed Care – PPO | Admitting: *Deleted

## 2015-11-14 DIAGNOSIS — Z3042 Encounter for surveillance of injectable contraceptive: Secondary | ICD-10-CM

## 2015-11-14 DIAGNOSIS — Z3040 Encounter for surveillance of contraceptives, unspecified: Secondary | ICD-10-CM | POA: Diagnosis not present

## 2015-11-14 LAB — POCT URINE PREGNANCY: PREG TEST UR: NEGATIVE

## 2015-11-14 MED ORDER — MEDROXYPROGESTERONE ACETATE 150 MG/ML IM SUSP
150.0000 mg | Freq: Once | INTRAMUSCULAR | Status: AC
  Administered 2015-11-14: 150 mg via INTRAMUSCULAR

## 2015-11-14 NOTE — Progress Notes (Signed)
   Pt late for Depo Provera injection.  Pregnancy test ordered; result negative. Advised patient to take another pregnancy test in 7 days.  Use another reliable form of birth control for the next 7-10 days.  Pt tolerated Depo injection. Depo given left upper outer quadrant.  Next injection due Jan. 10-24, 2018.  Reminder card given. Clovis PuMartin, Maressa Apollo L, RN

## 2015-11-21 ENCOUNTER — Other Ambulatory Visit: Payer: Self-pay | Admitting: Obstetrics and Gynecology

## 2015-11-21 NOTE — Telephone Encounter (Signed)
Please see if patient will come in to discuss daily suppression therapy. Individuals with more than 4 flares a year should be considered for daily suppression therapy. Will refill this but next fill needs an appointment.

## 2015-12-06 ENCOUNTER — Ambulatory Visit (INDEPENDENT_AMBULATORY_CARE_PROVIDER_SITE_OTHER): Payer: BC Managed Care – PPO | Admitting: Obstetrics and Gynecology

## 2015-12-06 ENCOUNTER — Encounter: Payer: Self-pay | Admitting: Obstetrics and Gynecology

## 2015-12-06 VITALS — BP 115/55 | HR 98 | Wt 217.0 lb

## 2015-12-06 DIAGNOSIS — Z23 Encounter for immunization: Secondary | ICD-10-CM | POA: Diagnosis not present

## 2015-12-06 DIAGNOSIS — M1711 Unilateral primary osteoarthritis, right knee: Secondary | ICD-10-CM | POA: Diagnosis not present

## 2015-12-06 DIAGNOSIS — N368 Other specified disorders of urethra: Secondary | ICD-10-CM | POA: Diagnosis not present

## 2015-12-06 LAB — POCT WET PREP (WET MOUNT)
Clue Cells Wet Prep Whiff POC: NEGATIVE
Trichomonas Wet Prep HPF POC: ABSENT

## 2015-12-06 LAB — POCT URINALYSIS DIPSTICK
Bilirubin, UA: NEGATIVE
Blood, UA: NEGATIVE
GLUCOSE UA: NEGATIVE
NITRITE UA: NEGATIVE
PROTEIN UA: NEGATIVE
Spec Grav, UA: >=1.03
UROBILINOGEN UA: 0.2
pH, UA: 6

## 2015-12-06 LAB — POCT UA - MICROSCOPIC ONLY

## 2015-12-06 MED ORDER — BENZOCAINE-MENTHOL 20-0.5 % EX AERO: 1.0000 "application " | INHALATION_SPRAY | Freq: Four times a day (QID) | CUTANEOUS | 0 refills | Status: DC | PRN

## 2015-12-06 NOTE — Patient Instructions (Signed)
Numbing spray to use prescribed No signs of infection  Could just be healing abrasions If not better in 2 weeks follow-up for more extensive testing.

## 2015-12-06 NOTE — Progress Notes (Signed)
Subjective:   Patient ID: Amy Miller, female    DOB: 10/17/1975, 40 y.o.   MRN: [id number]  Patient presents for Same Day Appointment  Chief Complaint  Patient presents with  . Vaginal Pain    HPI: # VAGINAL IRRITATION Present since Monday Burning with urinataion Never burned before Unsure if she has ever had a UTI. If she did was a long time ago Believe there is some kind of laceration near urethra. Has some irritation outside labia Thinks it could be due to rubbing to hard or from intercourse Maybe not enough lubrication No vaginal discharge  Uses protection and used protection with most recent encounter. Does not want STD testing  Symptoms Fever: no Dysuria: yes Vaginal bleeding: no Abdomen or Pelvic pain: no Back pain: no  #Knee Pain Follows with ortho for OA in right knee Has been 3 months since last steroid injection Knee pain worsening would like another injection  Review of Systems   See HPI for ROS.   Smoking status - never smoker  Past medical history, surgical, family, and social history reviewed and updated in the EMR as appropriate.  Objective:  BP (!) 115/55   Pulse 98   Wt 217 lb (98.4 kg)   BMI 42.38 kg/m  Vitals and nursing note reviewed  Physical Exam  Abdominal: Soft. She exhibits no distension. There is no tenderness. There is no guarding.  Genitourinary: Vagina normal. Vulva exhibits lesion. No vaginal discharge found.  Genitourinary Comments: No urethral lacerations appreciated. Does have area of irritation on outside of vulva on left.   Musculoskeletal:       Right knee: She exhibits bony tenderness.   Results for orders placed or performed in visit on 12/06/15 (from the past 48 hour(s))  POCT Wet Prep Mellody Drown([place]et [place])     Status: Abnormal   Collection Time: 12/06/15  3:52 PM  Result Value Ref Range   Source Wet Prep POC VAG    WBC, Wet Prep HPF POC 0-3    Bacteria Wet Prep HPF POC Many (A) None, Few, Too numerous to count   Clue  Cells Wet Prep HPF POC None None, Too numerous to count   Clue Cells Wet Prep Whiff POC Negative Whiff    Yeast Wet Prep HPF POC None    Trichomonas Wet Prep HPF POC Absent Absent  Urinalysis Dipstick     Status: Abnormal   Collection Time: 12/06/15  3:52 PM  Result Value Ref Range   Color, UA YELLOW    Clarity, UA CLEAR    Glucose, UA NEG    Bilirubin, UA NEG    Ketones, UA TRACE    Spec Grav, UA >=1.030    Blood, UA NEG    pH, UA 6.0    Protein, UA NEG    Urobilinogen, UA 0.2    Nitrite, UA NEG    Leukocytes, UA Trace (A) Negative  POCT UA - Microscopic Only     Status: None   Collection Time: 12/06/15  3:52 PM  Result Value Ref Range   WBC, Ur, HPF, POC 1-5    RBC, urine, microscopic NONE    Bacteria, U Microscopic TRACE    Mucus, UA 3+    Epithelial cells, urine per micros 1-5     Assessment & Plan:  1. Urethral irritation Collected wet prep and UA. Both normal without signs of infection or indication for why patient has this irritation. Believe she may have some kind of microscopic tear that  could be causing the irritation. Defer STD testing at this time per patient request. Rx for numbing spray given to help with discomfort with urination. Encouraged abstinence while having discomfort. Patient given return precautions. To follow-up in a week if symptoms not improved.  - POCT Wet Prep [facility]([place]et [place]) - Urinalysis Dipstick - POCT UA - Microscopic Only  2. Encounter for immunization - Flu Vaccine QUAD 36+ mos IM given  3. Osteoarthritis of right knee Known history. Chronic. Follows with ortho. Steroid injection into knee given today. Precautions given.  - methylPREDNISolone acetate (DEPO-MEDROL) injection 40 mg; Inject 1 mL (40 mg total) into the muscle once.   Caryl AdaJazma Damon Baisch, DO 12/06/2015, 1:18 PM PGY-3, [facility]

## 2015-12-07 DIAGNOSIS — M1711 Unilateral primary osteoarthritis, right knee: Secondary | ICD-10-CM

## 2015-12-07 MED ORDER — METHYLPREDNISOLONE ACETATE 40 MG/ML IJ SUSP
40.0000 mg | Freq: Once | INTRAMUSCULAR | Status: AC
  Administered 2015-12-07: 40 mg via INTRAMUSCULAR

## 2016-03-09 ENCOUNTER — Other Ambulatory Visit: Payer: Self-pay | Admitting: Obstetrics and Gynecology

## 2016-03-26 ENCOUNTER — Ambulatory Visit (INDEPENDENT_AMBULATORY_CARE_PROVIDER_SITE_OTHER): Payer: BC Managed Care – PPO | Admitting: *Deleted

## 2016-03-26 VITALS — Wt 254.2 lb

## 2016-03-26 DIAGNOSIS — Z3040 Encounter for surveillance of contraceptives, unspecified: Secondary | ICD-10-CM

## 2016-03-26 DIAGNOSIS — Z3042 Encounter for surveillance of injectable contraceptive: Secondary | ICD-10-CM

## 2016-03-26 LAB — POCT URINE PREGNANCY: PREG TEST UR: NEGATIVE

## 2016-03-26 MED ORDER — MEDROXYPROGESTERONE ACETATE 150 MG/ML IM SUSP
150.0000 mg | Freq: Once | INTRAMUSCULAR | Status: AC
  Administered 2016-03-26: 150 mg via INTRAMUSCULAR

## 2016-03-26 NOTE — Progress Notes (Signed)
    Pt late for Depo Provera injection.  Pregnancy test ordered; result negative. Advised patient to use another reliable form of birth control for 7-10 days.  Take another pregnancy test in 1 week at home or call for an appointment. Pt tolerated Depo injection. Depo given right upper outer quadrant.  Next injection due May 23-June 25, 2016.  Reminder card given. Clovis PuMartin, Marlow Berenguer L, RN

## 2016-05-21 ENCOUNTER — Other Ambulatory Visit: Payer: Self-pay | Admitting: Obstetrics and Gynecology

## 2016-06-09 ENCOUNTER — Other Ambulatory Visit: Payer: Self-pay | Admitting: Obstetrics and Gynecology

## 2016-07-11 ENCOUNTER — Ambulatory Visit (INDEPENDENT_AMBULATORY_CARE_PROVIDER_SITE_OTHER): Payer: BC Managed Care – PPO | Admitting: *Deleted

## 2016-07-11 DIAGNOSIS — Z3042 Encounter for surveillance of injectable contraceptive: Secondary | ICD-10-CM

## 2016-07-11 LAB — POCT URINE PREGNANCY: PREG TEST UR: NEGATIVE

## 2016-07-11 MED ORDER — MEDROXYPROGESTERONE ACETATE 150 MG/ML IM SUSP
150.0000 mg | Freq: Once | INTRAMUSCULAR | Status: AC
  Administered 2016-07-11: 150 mg via INTRAMUSCULAR

## 2016-07-11 NOTE — Progress Notes (Signed)
   Pt late for Depo Provera injection.  Pregnancy test ordered; result negative. Pt tolerated Depo injection. Depo given left upper outer quadrant.  Next injection due Sept 7-21, 2018.  Reminder card given. Clovis PuMartin, Tamika L, RN

## 2016-07-13 ENCOUNTER — Other Ambulatory Visit: Payer: Self-pay | Admitting: Family Medicine

## 2016-07-21 ENCOUNTER — Ambulatory Visit: Payer: BC Managed Care – PPO | Admitting: Obstetrics and Gynecology

## 2016-08-12 ENCOUNTER — Other Ambulatory Visit: Payer: Self-pay | Admitting: Obstetrics and Gynecology

## 2016-09-14 ENCOUNTER — Other Ambulatory Visit: Payer: Self-pay | Admitting: Obstetrics and Gynecology

## 2016-09-14 DIAGNOSIS — I10 Essential (primary) hypertension: Secondary | ICD-10-CM

## 2016-09-16 NOTE — Telephone Encounter (Signed)
Pt contacted and detailed message left informing of rx refill and the need to schedule a BP check. Please assist her in scheduling a nurse visit when she calls back.

## 2016-09-30 ENCOUNTER — Ambulatory Visit: Payer: BC Managed Care – PPO

## 2016-10-01 ENCOUNTER — Ambulatory Visit (INDEPENDENT_AMBULATORY_CARE_PROVIDER_SITE_OTHER): Payer: BC Managed Care – PPO | Admitting: *Deleted

## 2016-10-01 VITALS — BP 124/80

## 2016-10-01 DIAGNOSIS — Z3042 Encounter for surveillance of injectable contraceptive: Secondary | ICD-10-CM | POA: Diagnosis not present

## 2016-10-01 DIAGNOSIS — Z0130 Encounter for examination of blood pressure without abnormal findings: Secondary | ICD-10-CM

## 2016-10-01 MED ORDER — MEDROXYPROGESTERONE ACETATE 150 MG/ML IM SUSP
150.0000 mg | Freq: Once | INTRAMUSCULAR | Status: AC
  Administered 2016-10-01: 150 mg via INTRAMUSCULAR

## 2016-10-01 NOTE — Progress Notes (Signed)
   Pt in for Depo Provera injection.  Pt tolerated Depo injection. Depo given right upper outer quadrant.  Next injection due November 29-January 01, 2017. Discussed Nexplanon. Will make an appointment with PCP Reminder card given.   BP checked today. Denies symptoms. Taking medication as prescribed. Clovis PuMartin, Xavius Spadafore L, RN

## 2016-10-07 ENCOUNTER — Other Ambulatory Visit: Payer: Self-pay | Admitting: Obstetrics and Gynecology

## 2016-10-16 ENCOUNTER — Other Ambulatory Visit: Payer: Self-pay | Admitting: Family Medicine

## 2016-10-16 DIAGNOSIS — I10 Essential (primary) hypertension: Secondary | ICD-10-CM

## 2016-10-16 NOTE — Telephone Encounter (Signed)
Patient will need to make an appointment to be seen for further refills, she has not been evaluated for BP in over a year - gave enough for one month's supply.

## 2016-10-17 NOTE — Telephone Encounter (Signed)
Pt has been contacted and scheduled for 10/5 with pcp.

## 2016-10-23 NOTE — Progress Notes (Signed)
   Subjective:   Patient ID: Amy Miller    DOB: Jul 26, 1975, 41 y.o. female   MRN: [id number]  Amy Miller is a 41 y.o. female with a history of pregnancy-induced hypertension here for   Hypertension: - Medications: labetalol  daily, started after delivery 6 years ago. - Compliance: yes - Checking BP at home: only checks when feeling off - Denies any SOB, CP, vision changes, LE edema, medication SEs, or symptoms of hypotension - Diet: watching portions - Exercise: "needs to work on" - has lost 12 lbs since 03/2016  Desire to become pregnant - 41yo, wants to get pregnant in the next year or so and curious about risks of being pregnant at her age - Currently on depo shot for 6-7 years  Healthcare Maintenance - Vaccines: flu - Pap Smear: last one 09/2013, due 2020  Review of Systems:  Per HPI.   PMFSH: reviewed. Smoking status reviewed. Medications reviewed.  Objective:   BP 118/74   Pulse 99   Temp 99.2 F (37.3 C) (Oral)   Ht 5' (1.524 m)   Wt 242 lb (109.8 kg)   SpO2 99%   BMI 47.26 kg/m  Vitals and nursing note reviewed.  General: well nourished, well developed, in no acute distress with non-toxic appearance HEENT: normocephalic, atraumatic, moist mucous membranes CV: regular rate and rhythm without murmurs, rubs, or gallops, no lower extremity edema Lungs: clear to auscultation bilaterally with normal work of breathing Skin: warm, dry, no rashes or lesions Extremities: warm and well perfused, normal tone MSK: ROM grossly intact, gait normal Neuro: Alert and oriented, speech normal  Assessment & Plan:   Hypertension Stable and well controlled. On Labetalol  daily, was started for pregnancy-induced hypertension 6-7 years ago after the birth of her last child. 118/74 today. Dose decreased at last visit and HCTZ stopped.  Suspect she may eventually not need hypertensive control, especially as she continues to lose weight. - decrease Labetalol to   daily - exercise opportunities handout given  Desire for pregnancy Patient states she may want to become pregnant in the next year or so and wanted to know risks at her age of 41yo. Explained risk of AMA including chromosomal abnormalities, ectopic pregnancy, exacerbation of chronic conditions and effect on baby, etc. Reviewed patient's medication list and listed potential harmful meds that should be stopped prior to conception. Informed patient that once she picked a date to conceive, should stop depo 6 months prior and begin taking prenatal vitamins.  Orders Placed This Encounter  Procedures  . Flu Vaccine QUAD 36+ mos IM   Gave refills for Zoloft, Acyclovir, Flonase.  Ellwood Dense, DO PGY-1, [facility] 10/24/2016 5:06 PM

## 2016-10-24 ENCOUNTER — Ambulatory Visit (INDEPENDENT_AMBULATORY_CARE_PROVIDER_SITE_OTHER): Payer: BC Managed Care – PPO | Admitting: Family Medicine

## 2016-10-24 ENCOUNTER — Encounter: Payer: Self-pay | Admitting: Family Medicine

## 2016-10-24 VITALS — BP 118/74 | HR 99 | Temp 99.2°F | Ht 60.0 in | Wt 242.0 lb

## 2016-10-24 DIAGNOSIS — Z23 Encounter for immunization: Secondary | ICD-10-CM

## 2016-10-24 DIAGNOSIS — B029 Zoster without complications: Secondary | ICD-10-CM | POA: Diagnosis not present

## 2016-10-24 DIAGNOSIS — F32A Depression, unspecified: Secondary | ICD-10-CM

## 2016-10-24 DIAGNOSIS — J302 Other seasonal allergic rhinitis: Secondary | ICD-10-CM | POA: Diagnosis not present

## 2016-10-24 DIAGNOSIS — Z319 Encounter for procreative management, unspecified: Secondary | ICD-10-CM | POA: Diagnosis not present

## 2016-10-24 DIAGNOSIS — I10 Essential (primary) hypertension: Secondary | ICD-10-CM

## 2016-10-24 DIAGNOSIS — F329 Major depressive disorder, single episode, unspecified: Secondary | ICD-10-CM

## 2016-10-24 MED ORDER — FLUTICASONE PROPIONATE 50 MCG/ACT NA SUSP: 2.0000 | Freq: Every day | NASAL | 2 refills | Status: DC

## 2016-10-24 MED ORDER — SERTRALINE HCL 50 MG PO TABS: 50.0000 mg | ORAL_TABLET | Freq: Every day | ORAL | 1 refills | Status: DC

## 2016-10-24 MED ORDER — LABETALOL HCL 100 MG PO TABS: 50.0000 mg | ORAL_TABLET | Freq: Once | ORAL | 0 refills | Status: DC

## 2016-10-24 MED ORDER — ACYCLOVIR 400 MG PO TABS: ORAL_TABLET | ORAL | 0 refills | Status: DC

## 2016-10-24 NOTE — Patient Instructions (Signed)
It was great to see you!  For your blood pressure,  - You have been well controlled and we are decreasing your dose today to 71m. You can cut your current tablet in half with a pill cutter.  On becoming pregnant, - Once you decide whether or not to become pregnant, pick a date and stop the depo shots 6 months prior. At that time, we will decrease your Zoloft dose in preparation to come off of it before becoming pregnant. We can also switch to another allergy medication [place] at [place].  Congratulations on losing weight!  I encourage you to continue watching portion sizes and work to increase exercising so as to not need medication for blood pressure control and also to increase your chances of getting pregnant and reduce risks once you are pregnant.  Take care and seek immediate care sooner if you develop any concerns.   ARory Percy DO [facility]  Exercise Opportunities Look for opportunities to move your body throughout your day:  Never lie down when you can sit; never sit when you can stand; never stand when you can pace.  Moving your body throughout the day is just as important as the 30 or 60 minutes of exercise at the gym!  Get social Get active with your friends instead of going out to eat. Go for a hike, walk around the mall, or play an exercise-themed video game. Move more at work Fit more activity into the workday. Stand during phone calls, use a printer farther from your desk, and get up to stretch each hour. Do something new Develop a new skill to kick-start your motivation. Sign up for a class to learn how to [employer] surf, do tai chi, or play a sport. Keep cool in the pool Don't like to sweat? Hit the local community pool for a swim, water polo, or water aerobics class to stay cool while exercising. Stay on track Use a fitness tracker (FITBIT, Fitness Pal mobile app) to track your activity and provide motivation to reach your goals.

## 2016-10-24 NOTE — Assessment & Plan Note (Addendum)
Stable and well controlled. On Labetalol  daily, was started for pregnancy-induced hypertension 6-7 years ago after the birth of her last child. 118/74 today. Dose decreased at last visit and HCTZ stopped.  Suspect she may eventually not need hypertensive control, especially as she continues to lose weight. - decrease Labetalol to  daily - exercise opportunities handout given

## 2016-10-24 NOTE — Assessment & Plan Note (Signed)
Patient states she may want to become pregnant in the next year or so and wanted to know risks at her age of 41yo. Explained risk of AMA including chromosomal abnormalities, ectopic pregnancy, exacerbation of chronic conditions and effect on baby, etc. Reviewed patient's medication list and listed potential harmful meds that should be stopped prior to conception. Informed patient that once she picked a date to conceive, should stop depo 6 months prior and begin taking prenatal vitamins.

## 2016-12-22 ENCOUNTER — Other Ambulatory Visit: Payer: Self-pay | Admitting: Family Medicine

## 2016-12-22 DIAGNOSIS — I10 Essential (primary) hypertension: Secondary | ICD-10-CM

## 2017-01-29 ENCOUNTER — Ambulatory Visit (INDEPENDENT_AMBULATORY_CARE_PROVIDER_SITE_OTHER): Payer: BC Managed Care – PPO | Admitting: *Deleted

## 2017-01-29 DIAGNOSIS — Z3042 Encounter for surveillance of injectable contraceptive: Secondary | ICD-10-CM

## 2017-01-29 LAB — POCT URINE PREGNANCY: PREG TEST UR: NEGATIVE

## 2017-01-29 MED ORDER — MEDROXYPROGESTERONE ACETATE 150 MG/ML IM SUSY
150.0000 mg | PREFILLED_SYRINGE | Freq: Once | INTRAMUSCULAR | Status: AC
  Administered 2017-01-29: 150 mg via INTRAMUSCULAR

## 2017-01-29 NOTE — Progress Notes (Signed)
   Patient presents for Depo Provera injection States feeling well Last injection received 10/01/2016  Patient is overdue for injection. Urine pregnancy negative  Depo Provera given IM LUOQ. Patient tolerated well.  Patient states it has been "2 weeks" since last had unprotected sex Patient aware to use second method of birth control such as condoms and spermicide for next 7 days and schedule lab appt made for second UPT Patient also encouraged to sched OV for pap smear as she is overdue.  Next injection due March 28- April 30, 2017 Reminder card given Fredderick SeveranceUCATTE, Darlisa Spruiell L, RN

## 2017-02-11 ENCOUNTER — Telehealth: Payer: Self-pay | Admitting: *Deleted

## 2017-02-11 NOTE — Telephone Encounter (Addendum)
Patient left message on nurse line requesting to return to previous dosage of labetalol. States it was changed at last OV from 100 mg to 50 mg. Patient may be reached at [phone]. Kinnie FeilL. Pacey Willadsen, RN, BSN

## 2017-02-13 NOTE — Telephone Encounter (Signed)
Spoke with patient who stated she noticed increase in headaches, dizziness and felt "off" after decreasing her Labetalol dose from 100mg  to 50mg  at her last visit in October 2018. She was on the decreased dose for about a week, but went back to the 100mg  after a week of symptoms and her symptoms subsequently resolved. Informed patient of common side effects of decreasing doses of beta blockers and that they typically subside. Instructed patient to keep record of her Bps, and stay on the 100mg  until I see her for a follow up.  Scheduled her with me for 2/7.  Ellwood DenseAlison Rumball, DO PGY-1, [facility] 02/13/2017 2:04 PM

## 2017-02-16 ENCOUNTER — Telehealth: Payer: Self-pay | Admitting: Family Medicine

## 2017-02-16 ENCOUNTER — Other Ambulatory Visit: Payer: Self-pay | Admitting: Family Medicine

## 2017-02-16 DIAGNOSIS — I10 Essential (primary) hypertension: Secondary | ICD-10-CM

## 2017-02-16 MED ORDER — LABETALOL HCL 100 MG PO TABS: 100.0000 mg | ORAL_TABLET | Freq: Every day | ORAL | 0 refills | Status: DC

## 2017-02-16 NOTE — Telephone Encounter (Signed)
Refill sent to pharmacy.  Ellwood DenseAlison Moritz Lever, DO PGY-1, [facility] 02/16/2017 9:08 PM

## 2017-02-16 NOTE — Telephone Encounter (Signed)
Pt has been taking labetalol refilled.  She is suppose to be taking100 mg per day .  After taking .5  Tablet, and not feeling right,  she talked  to dr Linwood Dibblesrumball Friday. dr agreed for her 100 mg.  She doesn't have any more pill. The pharmacy she uses is [facility] on [address] in [place]

## 2017-02-17 NOTE — Telephone Encounter (Signed)
Patient aware.

## 2017-02-26 ENCOUNTER — Ambulatory Visit: Payer: BC Managed Care – PPO | Admitting: Family Medicine

## 2017-03-02 ENCOUNTER — Other Ambulatory Visit: Payer: Self-pay | Admitting: *Deleted

## 2017-03-02 DIAGNOSIS — F32A Depression, unspecified: Secondary | ICD-10-CM

## 2017-03-02 DIAGNOSIS — F329 Major depressive disorder, single episode, unspecified: Secondary | ICD-10-CM

## 2017-03-04 ENCOUNTER — Other Ambulatory Visit: Payer: Self-pay | Admitting: Family Medicine

## 2017-03-04 DIAGNOSIS — F329 Major depressive disorder, single episode, unspecified: Secondary | ICD-10-CM

## 2017-03-04 DIAGNOSIS — F32A Depression, unspecified: Secondary | ICD-10-CM

## 2017-03-04 MED ORDER — SERTRALINE HCL 50 MG PO TABS: 50.0000 mg | ORAL_TABLET | Freq: Every day | ORAL | 0 refills | Status: DC

## 2017-03-04 NOTE — Telephone Encounter (Signed)
Cool, thanks. Refill sent.

## 2017-04-08 NOTE — Progress Notes (Signed)
Subjective:   Patient ID: Amy Miller    DOB: 02/22/1975, 42 y.o. female   MRN: [id number]  Amy Miller is a 42 y.o. female with a history of HTN, depression here for well woman/preventative visit.  Acute Concerns: none   Sexual/Birth History: G2P1011, currently sexually active with 1 partner  Birth Control: depo for the past 7 years, well tolerated.  POA/Living Will: None  Hypertension: - Medications: Labetalol 100mg . Discussed dose decrease to 50mg  at last visit 10/2016, but after experiencing nausea, dizziness, felt "off" for ~1 week she increased back to 100mg . - Compliance: good - Checking BP at home: only when feeling off. Denies low BP readings, highest (133/85, HR 99) - Denies any SOB, CP, vision changes, LE edema, medication SEs, or symptoms of hypotension  Review of Systems: Per HPI.    PMH, PSH, Medications, Allergies, and FmHx reviewed and updated in EMR.  Social History: Non smoker  Immunization:  Tdap/TD: due 2020  Influenza: received earlier this season   Cancer Screening:  Pap Smear: last 2015 with normal co-testing, due 2020  Anxiety/Depression - Medications: Zoloft 50mg  daily with good compliance. States she can tell a difference in her anxiety level when she doesn't take it. - Current stressors: full time principal, working on doctorate. Wife/mom. - Coping Mechanisms: playing with daughter    Objective:   BP 110/72   Pulse 86   Temp 99.4 F (37.4 C) (Oral)   Ht 5' (1.524 m)   Wt 246 lb 3.2 oz (111.7 kg)   SpO2 98%   BMI 48.08 kg/m  Vitals and nursing note reviewed.  General: well nourished, well developed, in no acute distress with non-toxic appearance HEENT: normocephalic, atraumatic, moist mucous membranes Neck: supple, non-tender without lymphadenopathy CV: regular rate and rhythm without murmurs, rubs, or gallops, no lower extremity edema Lungs: clear to auscultation bilaterally with normal work of breathing Extremities: warm and well  perfused, normal tone MSK: ROM grossly intact, strength intact, gait normal Neuro: Alert and oriented, speech normal    [facility]  PHQ-9 Total Score  5      Assessment & Plan:   Amy Miller is a 42yo female here for annual well woman/preventative exam and   Hypertension 110/72 today. Unsuccessful dose decrease at last visit due to symptoms of dizziness, nausea, and feeling "off." Counseled on common side effects of beta blocker dose titration and that effects typically cease within a few weeks after staying on new dose but patient remains resistant to trial dose adjustment or switching to alternative agent despite counseling. Informed of risks of lower BP. Will continue to assess at future visits.  Depression Stable on current dose of Zoloft with good effect noted by patient. Patient has adequate coping mechanisms despite current stressors. Refill given.  No orders of the defined types were placed in this encounter.  Meds ordered this encounter  Medications  . acyclovir (ZOVIRAX) 400 MG tablet    Sig: TAKE 1 TABLET BY MOUTH THREE TIMES DAILY AS NEEDED FOR  FLARES  *TOTAL  OF  5  DAYS  PER  EPISODE    Dispense:  20 tablet    Refill:  0    Please consider 90 day supplies to promote better adherence  . sertraline (ZOLOFT) 50 MG tablet    Sig: Take 1 tablet (50 mg total) by mouth daily.    Dispense:  90 tablet    Refill:  2  Please consider 90 day supplies to promote better adherence    Ellwood Dense, DO PGY-1, [facility] 04/10/2017 12:48 PM

## 2017-04-09 ENCOUNTER — Encounter: Payer: Self-pay | Admitting: Family Medicine

## 2017-04-09 ENCOUNTER — Other Ambulatory Visit: Payer: Self-pay

## 2017-04-09 ENCOUNTER — Ambulatory Visit (INDEPENDENT_AMBULATORY_CARE_PROVIDER_SITE_OTHER): Payer: BC Managed Care – PPO | Admitting: Family Medicine

## 2017-04-09 VITALS — BP 110/72 | HR 86 | Temp 99.4°F | Ht 60.0 in | Wt 246.2 lb

## 2017-04-09 DIAGNOSIS — F329 Major depressive disorder, single episode, unspecified: Secondary | ICD-10-CM | POA: Diagnosis not present

## 2017-04-09 DIAGNOSIS — I10 Essential (primary) hypertension: Secondary | ICD-10-CM | POA: Diagnosis not present

## 2017-04-09 DIAGNOSIS — B029 Zoster without complications: Secondary | ICD-10-CM | POA: Diagnosis not present

## 2017-04-09 DIAGNOSIS — F32A Depression, unspecified: Secondary | ICD-10-CM

## 2017-04-09 MED ORDER — SERTRALINE HCL 50 MG PO TABS: 50.0000 mg | ORAL_TABLET | Freq: Every day | ORAL | 2 refills | Status: DC

## 2017-04-09 MED ORDER — ACYCLOVIR 400 MG PO TABS: ORAL_TABLET | ORAL | 0 refills | Status: DC

## 2017-04-09 NOTE — Patient Instructions (Signed)
It was great to see you!  For your hypertension,  - We will keep your Labetalol at 100mg  per your request. Certainly if you notice lower blood pressures, be sure to write them down and call the clinic to be seen. - Dizziness, fatigue, and feeling your heart racing are common side effects of decreased dosages of the class of medications as Labetalol. Symptoms should typically last for a few weeks then subside.  - It would be good to taper you off of Labetalol to see if you even need hypertensive control. If so, a different agent would be preferred to protect against heart attacks and strokes.  For your pap smear, - You are due in 2020.  I am refilling your sertraline and acyclovir, they should be at your pharmacy.  Take care and seek immediate care sooner if you develop any concerns.   Ellwood DenseAlison Rumball, DO [facility]

## 2017-04-10 NOTE — Assessment & Plan Note (Signed)
Stable on current dose of Zoloft with good effect noted by patient. Patient has adequate coping mechanisms despite current stressors. Refill given.

## 2017-04-10 NOTE — Assessment & Plan Note (Signed)
110/72 today. Unsuccessful dose decrease at last visit due to symptoms of dizziness, nausea, and feeling "off." Counseled on common side effects of beta blocker dose titration and that effects typically cease within a few weeks after staying on new dose but patient remains resistant to trial dose adjustment or switching to alternative agent despite counseling. Informed of risks of lower BP. Will continue to assess at future visits.

## 2017-05-05 ENCOUNTER — Ambulatory Visit (INDEPENDENT_AMBULATORY_CARE_PROVIDER_SITE_OTHER): Payer: BC Managed Care – PPO

## 2017-05-05 DIAGNOSIS — Z3042 Encounter for surveillance of injectable contraceptive: Secondary | ICD-10-CM

## 2017-05-05 LAB — POCT URINE PREGNANCY: Preg Test, Ur: NEGATIVE

## 2017-05-05 MED ORDER — MEDROXYPROGESTERONE ACETATE 150 MG/ML IM SUSY
150.0000 mg | PREFILLED_SYRINGE | Freq: Once | INTRAMUSCULAR | Status: AC
  Administered 2017-05-05: 150 mg via INTRAMUSCULAR

## 2017-05-05 NOTE — Progress Notes (Signed)
Patient here today for Depo Provera injection.  Last Depo given on 01/29/17 and is late for Depo.  Obtained urine pregnancy test today and is negative.  Patient states last sexual activity was 2 months ago. Depo given today RUOQ.  Site unremarkable & patient tolerated injection.  Next injection due July 2-16 and reminder card given.  Patient instructed to use protection x one week and given condoms.  Instructed to return for nurse visit in one week for repeat pregnancy test per office protocol.  Patient verbalized understanding.    Shawna OrleansMeredith B Andretta Ergle, RN

## 2017-05-13 ENCOUNTER — Other Ambulatory Visit: Payer: Self-pay | Admitting: Family Medicine

## 2017-05-15 ENCOUNTER — Other Ambulatory Visit: Payer: Self-pay

## 2017-05-15 ENCOUNTER — Other Ambulatory Visit: Payer: Self-pay | Admitting: Family Medicine

## 2017-05-15 DIAGNOSIS — I10 Essential (primary) hypertension: Secondary | ICD-10-CM

## 2017-05-15 NOTE — Telephone Encounter (Signed)
Pt requesting refill of labetalol. It look like requests have been sent to Dr. Jimmey RalphParker and have been denied, pt is upset that she is still waiting on her refill.  Pt call back [phone] Shawna OrleansMeredith B Thomsen, RN

## 2017-05-19 MED ORDER — LABETALOL HCL 100 MG PO TABS: 100.0000 mg | ORAL_TABLET | Freq: Every day | ORAL | 0 refills | Status: DC

## 2017-05-19 NOTE — Telephone Encounter (Signed)
Refilled per standing order because patient has been out several days. Amy Miller, assistant director to call to speak to patient. Ples Specter, RN [facility] RN)

## 2017-07-24 ENCOUNTER — Ambulatory Visit (INDEPENDENT_AMBULATORY_CARE_PROVIDER_SITE_OTHER): Payer: BC Managed Care – PPO | Admitting: *Deleted

## 2017-07-24 DIAGNOSIS — Z3042 Encounter for surveillance of injectable contraceptive: Secondary | ICD-10-CM | POA: Diagnosis not present

## 2017-07-24 MED ORDER — MEDROXYPROGESTERONE ACETATE 150 MG/ML IM SUSP
150.0000 mg | Freq: Once | INTRAMUSCULAR | Status: AC
  Administered 2017-07-24: 150 mg via INTRAMUSCULAR

## 2017-07-24 NOTE — Progress Notes (Signed)
Patient here today for Depo Provera injection.  Depo given today LUOQ.  Site unremarkable & patient tolerated injection.  Next injection due 10/09/17 - 10/23/17.  Reminder card given.    Marquett Bertoli, Maryjo RochesterJessica Dawn, CMA

## 2017-08-20 ENCOUNTER — Other Ambulatory Visit: Payer: Self-pay | Admitting: Family Medicine

## 2017-08-20 DIAGNOSIS — I10 Essential (primary) hypertension: Secondary | ICD-10-CM

## 2017-08-21 ENCOUNTER — Other Ambulatory Visit (INDEPENDENT_AMBULATORY_CARE_PROVIDER_SITE_OTHER): Payer: Self-pay | Admitting: Physician Assistant

## 2017-08-21 ENCOUNTER — Ambulatory Visit
Admission: RE | Admit: 2017-08-21 | Discharge: 2017-08-21 | Disposition: A | Discharge status: Home / Self Care | Payer: BC Managed Care – PPO | Source: Ambulatory Visit | Attending: Physician Assistant | Admitting: Physician Assistant

## 2017-08-21 DIAGNOSIS — R52 Pain, unspecified: Secondary | ICD-10-CM

## 2017-08-28 ENCOUNTER — Other Ambulatory Visit: Payer: Self-pay | Admitting: Family Medicine

## 2017-08-28 DIAGNOSIS — B029 Zoster without complications: Secondary | ICD-10-CM

## 2017-11-13 ENCOUNTER — Ambulatory Visit (INDEPENDENT_AMBULATORY_CARE_PROVIDER_SITE_OTHER): Payer: BC Managed Care – PPO | Admitting: *Deleted

## 2017-11-13 DIAGNOSIS — Z3042 Encounter for surveillance of injectable contraceptive: Secondary | ICD-10-CM

## 2017-11-13 LAB — POCT URINE PREGNANCY: Preg Test, Ur: NEGATIVE

## 2017-11-13 MED ORDER — MEDROXYPROGESTERONE ACETATE 150 MG/ML IM SUSY
150.0000 mg | PREFILLED_SYRINGE | Freq: Once | INTRAMUSCULAR | Status: AC
  Administered 2017-11-13: 150 mg via INTRAMUSCULAR

## 2017-11-13 NOTE — Progress Notes (Signed)
Patient here today for Depo Provera injection.  Last Depo given on 07/24/17 and is late for Depo.  Obtained urine pregnancy test today and is negative.  Patient states last sexual activity was 3 weeks ago and did not use protection.  Depo given today 11/13/17.  Site unremarkable & patient tolerated injection.  Next injection due 01/29/18 - 02/12/18 and reminder card given.  Patient instructed to use protection x one week and given condoms.   Patient verbalized understanding.    Isabel Ardila, Maryjo Rochester, CMA

## 2017-11-27 ENCOUNTER — Other Ambulatory Visit: Payer: Self-pay

## 2017-11-27 DIAGNOSIS — I10 Essential (primary) hypertension: Secondary | ICD-10-CM

## 2017-11-30 MED ORDER — LABETALOL HCL 100 MG PO TABS: 100.0000 mg | ORAL_TABLET | Freq: Every day | ORAL | 0 refills | Status: DC

## 2017-11-30 NOTE — Telephone Encounter (Signed)
Refill provided. Please have patient make appointment for follow up of high blood pressure next available with PCP.

## 2017-12-21 ENCOUNTER — Other Ambulatory Visit: Payer: Self-pay

## 2017-12-21 DIAGNOSIS — J302 Other seasonal allergic rhinitis: Secondary | ICD-10-CM

## 2017-12-21 MED ORDER — FLUTICASONE PROPIONATE 50 MCG/ACT NA SUSP: 2.0000 | Freq: Every day | NASAL | 2 refills | Status: DC

## 2017-12-31 ENCOUNTER — Other Ambulatory Visit: Payer: Self-pay

## 2017-12-31 DIAGNOSIS — I10 Essential (primary) hypertension: Secondary | ICD-10-CM

## 2017-12-31 MED ORDER — LABETALOL HCL 100 MG PO TABS: 100.0000 mg | ORAL_TABLET | Freq: Every day | ORAL | 0 refills | Status: DC

## 2017-12-31 NOTE — Telephone Encounter (Signed)
Refill provided. Please have patient make appointment to be seen.

## 2018-01-26 ENCOUNTER — Other Ambulatory Visit: Payer: Self-pay | Admitting: *Deleted

## 2018-01-26 DIAGNOSIS — I10 Essential (primary) hypertension: Secondary | ICD-10-CM

## 2018-01-26 MED ORDER — LABETALOL HCL 100 MG PO TABS: 100.0000 mg | ORAL_TABLET | Freq: Every day | ORAL | 0 refills | Status: DC

## 2018-01-26 NOTE — Telephone Encounter (Signed)
Pt was not informed at last refill to make appt.  I have made her an appt for 02/11/18, can she have a refill until then? Lakoda Raske, Maryjo Rochester, CMA

## 2018-02-04 ENCOUNTER — Other Ambulatory Visit: Payer: Self-pay | Admitting: Family Medicine

## 2018-02-04 DIAGNOSIS — B029 Zoster without complications: Secondary | ICD-10-CM

## 2018-02-10 NOTE — Progress Notes (Signed)
Subjective:   Patient ID: Amy Miller    DOB: 10/11/1975, 43 y.o. female   MRN: [id number]  Amy Miller is a 43 y.o. female with a history of HTN, migraine, AR, GERD, OA, morbid obesity, HSV2, depression here for   Hypertension: - Medications: labetalol 100mg  daily, HCTZ 25mg  daily. Previously discussed BB taper but patient did not tolerate. - Compliance: not taking HCTZ due to headaches. Takes labetalol in the morning.  - Checking BP at home: only if she feels off - had been having some nose bleeds 1 month ago, is concerned blood pressures are too high as that is how her preeclampsia started when she was pregnant. Nose bleeds have now stopped. - Denies any SOB, medication SEs, or symptoms of hypotension - endorses some chest tightness when she has anxiety, some LE edema, using compression stockings. - Diet: watches what she eats although notes she "stress eats." Eats 3 meals per day with snacks (nabs/crackers). Sodas, usually drinks 1 pepsi. Eats fruits and vegetables. - Exercise: none due to knee pain, now on gabapentin.  Anxiety - On Zoloft 50mg  daily, taking for depression. Has been on that dose for a while. - started taking right after daughter was born, depression has been pretty well controlled. - stressors: work, is a principal - coping mechanisms: food, stress eats - has seen nutritionist in the past but is difficult to work out appointments with her work schedule  Review of Systems:  Per HPI.  PMFSH, medications and smoking status reviewed.  Objective:   BP (!) 110/52   Pulse 91   Temp 99.5 F (37.5 C) (Oral)   Ht 5' (1.524 m)   Wt 248 lb 6 oz (112.7 kg)   SpO2 98%   BMI 48.51 kg/m  Vitals and nursing note reviewed.  General: morbidly obese female, in no acute distress with non-toxic appearance CV: regular rate and rhythm without murmurs, rubs, or gallops, no lower extremity edema Lungs: clear to auscultation bilaterally with normal work of breathing Skin: warm,  dry, no rashes or lesions Extremities: warm and well perfused, normal tone MSK: ROM grossly intact, strength intact, gait normal Neuro: Alert and oriented, speech normal  GAD 7 : Generalized Anxiety Score 02/11/2018  Nervous, Anxious, on Edge 3  Control/stop worrying 3  Worry too much - different things 2  Trouble relaxing 3  Restless 3  Easily annoyed or irritable 3  Afraid - awful might happen 1  Total GAD 7 Score 18  Anxiety Difficulty Extremely difficult    Assessment & Plan:   Hypertension BP low normal today. Patient resistant to try dose decrease of labetalol today given she did not tolerate dose decrease after last visit. Again discussed lack of benefit from BB for BP control and need to switch to different antihypertensive should she even need antihypertensive control. Patient very resistant to try today, agreed to try dose decrease with aim for taper during the summer when she is not as stressed at work.   OBESITY, MORBID Discussed lifestyle modifications through diet and exercise. Patient wants to explore bariatric surgery. Discussed weight management clinic prior to bariatric options, patient is amenable, referral placed.  Anxiety GAD 18 today. States work is main stressor. Currently on zoloft, will increase dose today. Discussed need for counseling, [facility]  Orders Placed This Encounter  Procedures  . Amb Ref to Medical Weight Management    Referral Priority:   Routine    Referral Type:   Consultation  Number of Visits Requested:   1  . HgB A1c  . POCT urine pregnancy   Meds ordered this encounter  Medications  . sertraline (ZOLOFT) 50 MG tablet    Sig: Take 1.5 tablets (75 mg total) by mouth daily.    Dispense:  135 tablet    Refill:  2    Please consider 90 day supplies to promote better adherence  . medroxyPROGESTERone (DEPO-PROVERA) injection 150 mg    Ellwood Dense, DO PGY-2, [facility] 02/12/2018 12:33 PM

## 2018-02-11 ENCOUNTER — Ambulatory Visit: Payer: BC Managed Care – PPO | Admitting: Family Medicine

## 2018-02-11 ENCOUNTER — Encounter: Payer: Self-pay | Admitting: Family Medicine

## 2018-02-11 ENCOUNTER — Other Ambulatory Visit: Payer: Self-pay

## 2018-02-11 VITALS — BP 110/52 | HR 91 | Temp 99.5°F | Ht 60.0 in | Wt 248.4 lb

## 2018-02-11 DIAGNOSIS — I10 Essential (primary) hypertension: Secondary | ICD-10-CM | POA: Diagnosis not present

## 2018-02-11 DIAGNOSIS — F329 Major depressive disorder, single episode, unspecified: Secondary | ICD-10-CM

## 2018-02-11 DIAGNOSIS — F419 Anxiety disorder, unspecified: Secondary | ICD-10-CM

## 2018-02-11 DIAGNOSIS — Z30019 Encounter for initial prescription of contraceptives, unspecified: Secondary | ICD-10-CM

## 2018-02-11 DIAGNOSIS — F32A Depression, unspecified: Secondary | ICD-10-CM

## 2018-02-11 LAB — POCT URINE PREGNANCY: Preg Test, Ur: NEGATIVE

## 2018-02-11 LAB — POCT GLYCOSYLATED HEMOGLOBIN (HGB A1C): Hemoglobin A1C: 5.3 % (ref 4.0–5.6)

## 2018-02-11 MED ORDER — MEDROXYPROGESTERONE ACETATE 150 MG/ML IM SUSP
150.0000 mg | Freq: Once | INTRAMUSCULAR | Status: AC
  Administered 2018-02-11: 150 mg via INTRAMUSCULAR

## 2018-02-11 MED ORDER — SERTRALINE HCL 50 MG PO TABS: 75.0000 mg | ORAL_TABLET | Freq: Every day | ORAL | 2 refills | Status: DC

## 2018-02-11 NOTE — Patient Instructions (Signed)
It was great to see you!  Our plans for today:  - We are increasing your sertraline. Call us or come by for an appointment to let us know how this is going for you. - We placed a referral to weight management. - We will try to taper your labetalol during the summer.  Take care and seek immediate care sooner if you develop any concerns.   Dr. [facility]

## 2018-02-12 ENCOUNTER — Encounter: Payer: Self-pay | Admitting: Family Medicine

## 2018-02-12 DIAGNOSIS — F419 Anxiety disorder, unspecified: Secondary | ICD-10-CM | POA: Insufficient documentation

## 2018-02-12 NOTE — Assessment & Plan Note (Signed)
GAD 18 today. States work is main stressor. Currently on zoloft, will increase dose today. Discussed need for counseling, will have patient make appointment with [facility].

## 2018-02-12 NOTE — Assessment & Plan Note (Signed)
Discussed lifestyle modifications through diet and exercise. Patient wants to explore bariatric surgery. Discussed weight management clinic prior to bariatric options, patient is amenable, referral placed.

## 2018-02-12 NOTE — Assessment & Plan Note (Signed)
BP low normal today. Patient resistant to try dose decrease of labetalol today given she did not tolerate dose decrease after last visit. Again discussed lack of benefit from BB for BP control and need to switch to different antihypertensive should she even need antihypertensive control. Patient very resistant to try today, agreed to try dose decrease with aim for taper during the summer when she is not as stressed at work.

## 2018-02-16 ENCOUNTER — Telehealth: Payer: Self-pay | Admitting: Licensed Clinical Social Worker

## 2018-02-16 NOTE — Telephone Encounter (Signed)
   Amy Miller is a 43 y.o. female referred by Dr. Jake Bathe for assistance with getting connected to behavioral health resources.  Called patient to assess needs and provide appropriate resources.  Left message for patient to call LCSW.  Update provided to PCP.  Plan:  LCSW will wait for return call, if no return call is received will call back in 5 to 7 days.  Sammuel Hines, LCSW [facility]   [phone] 10:43 AM

## 2018-02-19 NOTE — Telephone Encounter (Signed)
2nd F/U call to patient. Left message and phone number to call LCSW ref. referral from PCP.  Plan: LCSW will wait for patient  Tamela Gammon [facility]   [phone] 4:21 PM

## 2018-03-01 ENCOUNTER — Other Ambulatory Visit: Payer: Self-pay | Admitting: Family Medicine

## 2018-03-01 DIAGNOSIS — I10 Essential (primary) hypertension: Secondary | ICD-10-CM

## 2018-05-27 ENCOUNTER — Other Ambulatory Visit: Payer: Self-pay

## 2018-05-27 ENCOUNTER — Ambulatory Visit (INDEPENDENT_AMBULATORY_CARE_PROVIDER_SITE_OTHER): Payer: BC Managed Care – PPO

## 2018-05-27 DIAGNOSIS — Z3042 Encounter for surveillance of injectable contraceptive: Secondary | ICD-10-CM | POA: Diagnosis not present

## 2018-05-27 LAB — POCT URINE PREGNANCY: Preg Test, Ur: NEGATIVE

## 2018-05-27 MED ORDER — MEDROXYPROGESTERONE ACETATE 150 MG/ML IM SUSP
150.0000 mg | Freq: Once | INTRAMUSCULAR | Status: AC
  Administered 2018-05-27: 150 mg via INTRAMUSCULAR

## 2018-05-27 NOTE — Progress Notes (Signed)
Patient presents in nurse clinic for depo injection. Pt is not within dates, urine pregnancy obtained and negative. Depo given without complication, RUOQ. Pt due back for next injection 7/23-8/6, reminder card given.

## 2018-07-02 ENCOUNTER — Other Ambulatory Visit: Payer: Self-pay | Admitting: Family Medicine

## 2018-07-02 DIAGNOSIS — B029 Zoster without complications: Secondary | ICD-10-CM

## 2018-08-02 ENCOUNTER — Ambulatory Visit (INDEPENDENT_AMBULATORY_CARE_PROVIDER_SITE_OTHER): Payer: BC Managed Care – PPO | Admitting: Family Medicine

## 2018-08-02 ENCOUNTER — Encounter: Payer: Self-pay | Admitting: Family Medicine

## 2018-08-02 ENCOUNTER — Other Ambulatory Visit: Payer: Self-pay

## 2018-08-02 VITALS — BP 128/68 | HR 88

## 2018-08-02 DIAGNOSIS — J45998 Other asthma: Secondary | ICD-10-CM

## 2018-08-02 DIAGNOSIS — R062 Wheezing: Secondary | ICD-10-CM

## 2018-08-02 MED ORDER — ALBUTEROL SULFATE HFA 108 (90 BASE) MCG/ACT IN AERS: 1.0000 | INHALATION_SPRAY | Freq: Four times a day (QID) | RESPIRATORY_TRACT | 0 refills | Status: DC | PRN

## 2018-08-02 NOTE — Assessment & Plan Note (Addendum)
Amy Miller has a history of allergies and reports typical seasonal allergies lately.  With his history, it is possible Amy Miller is experiencing symptoms of chronic asthma though this may simply be related to post viral syndrome.  It is difficult to tell at this time without pulmonary function tests.  As pulmonary function tests will not be performed in our clinic in the near future (due to coronavirus) we will make do without them.  Amy Miller was given a refill of an albuterol inhaler and instructed to only use it for shortness of breath or wheezing for the next 2-3 weeks.  If Amy Miller notices that Amy Miller requires less and less albuterol, no follow-up visit is needed.  If Amy Miller requires more albuterol or as much as Amy Miller is currently taking (2 puffs in the a.m., 1 puff in the p.m.) Amy Miller should follow-up with our clinic again for another appointment.  At that time, it would be helpful to start her on Breo Elliptat.

## 2018-08-02 NOTE — Progress Notes (Signed)
    Subjective:  Amy Miller is a 43 y.o. female who presents to the [facility] today with a chief complaint of albuterol refill.   HPI: Wheezing/shortness of breath Amy Miller has no previous medical history of asthma/COPD.  She is a non-smoker.  She reports that her breathing issues started about 3 months ago when she was seen at urgent care due to shortness of breath and wheezing.  At that time, she had been put on albuterol to take 2 puffs as needed every 6 hours.  Since she was seen at that urgent care, she has continued taking the albuterol 2 puffs in the morning, 1 puff in the evening.  She reports that she generally does not notice wheezing although she feels subjective tightness in her chest which is relieved by the albuterol.  She presented to clinic today in order to receive a refill of her albuterol.  She reports that she often has a nighttime cough about 4-5 nights per week.  She does experience chronic cough as well as seems to be worse at night.  She denies fevers, shortness of breath, chest pain, palpitations.  More recently, she was diagnosed with coronavirus on six 6/13 was later cleared to go back to work on 7/23.  Chief Complaint noted Review of Symptoms - see HPI PMH -no cigarettes, no marijuana, no asthma, no COPD, family history of asthma in a niece  Objective:  Physical Exam: BP 128/68   Pulse 88   SpO2 97%    Gen: NAD, resting comfortably CV: RRR with no murmurs appreciated Pulm: NWOB, CTAB with no crackles, wheezes, or rhonchi.  Breathing comfortably on room air GI: Normal bowel sounds present. Soft, Nontender, Nondistended.   No results found for this or any previous visit (from the past 72 hour(s)).   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  With his history, it is possible she is experiencing symptoms of chronic asthma though this may simply be related to post viral syndrome.  It is  difficult to tell at this time without pulmonary function tests.  As pulmonary function tests will not be performed in our clinic in the near future (due to coronavirus) we will make do without them.  She was given a refill of an albuterol inhaler and instructed to only use it for shortness of breath or wheezing for the next 2-3 weeks.  If she notices that she requires less and less albuterol, no follow-up visit is needed.  If she requires more albuterol or as much as she is currently taking (2 puffs in the a.m., 1 puff in the p.m.) she should follow-up with our clinic again for another appointment.  At that time, it would be helpful to start her on Breo Elliptat.

## 2018-08-02 NOTE — Patient Instructions (Signed)
I sent in a prescription for albuterol.  Use it sparingly, I am not positive that it is needed in your situation. Your breathing difficulty may be a simple post viral issue or it may be a true underlying lung disease. It will be important to follow up for pulmonary function tests.  I am going to reach out to the pharmacist who does the test here.  Please call the clinic if you don't hear from [place] in one week.

## 2018-08-13 ENCOUNTER — Ambulatory Visit (INDEPENDENT_AMBULATORY_CARE_PROVIDER_SITE_OTHER): Payer: BC Managed Care – PPO | Admitting: *Deleted

## 2018-08-13 ENCOUNTER — Other Ambulatory Visit: Payer: Self-pay

## 2018-08-13 DIAGNOSIS — Z3042 Encounter for surveillance of injectable contraceptive: Secondary | ICD-10-CM

## 2018-08-13 MED ORDER — MEDROXYPROGESTERONE ACETATE 150 MG/ML IM SUSY
150.0000 mg | PREFILLED_SYRINGE | Freq: Once | INTRAMUSCULAR | Status: AC
  Administered 2018-08-13: 15:00:00 150 mg via INTRAMUSCULAR

## 2018-08-13 NOTE — Progress Notes (Signed)
Patient here today for Depo Provera injection and is within her dates.    Last contraceptive appt was 2018.  ( she will make physical within next due dates)  Depo given in [place] today.  Site unremarkable & patient tolerated injection.    Next injection due 10/29/18 - 11/12/18.  Reminder card given.    Christen Bame, CMA

## 2018-08-26 ENCOUNTER — Ambulatory Visit: Payer: BC Managed Care – PPO | Admitting: Family Medicine

## 2018-09-04 ENCOUNTER — Other Ambulatory Visit: Payer: Self-pay | Admitting: Family Medicine

## 2018-09-04 DIAGNOSIS — I10 Essential (primary) hypertension: Secondary | ICD-10-CM

## 2018-09-06 NOTE — Telephone Encounter (Signed)
Refill provided. Please have patient make appointment to f/u on blood pressure and anxiety.

## 2018-09-10 ENCOUNTER — Ambulatory Visit: Payer: BC Managed Care – PPO | Admitting: Family Medicine

## 2018-09-24 ENCOUNTER — Ambulatory Visit: Payer: BC Managed Care – PPO | Admitting: Family Medicine

## 2018-10-01 ENCOUNTER — Ambulatory Visit: Payer: BC Managed Care – PPO | Admitting: Family Medicine

## 2018-11-25 ENCOUNTER — Other Ambulatory Visit: Payer: Self-pay

## 2018-11-25 ENCOUNTER — Ambulatory Visit (INDEPENDENT_AMBULATORY_CARE_PROVIDER_SITE_OTHER): Payer: BC Managed Care – PPO | Admitting: *Deleted

## 2018-11-25 DIAGNOSIS — Z3042 Encounter for surveillance of injectable contraceptive: Secondary | ICD-10-CM

## 2018-11-25 LAB — POCT URINE PREGNANCY: Preg Test, Ur: NEGATIVE

## 2018-11-25 MED ORDER — MEDROXYPROGESTERONE ACETATE 150 MG/ML IM SUSP: 150.0000 mg | Freq: Once | INTRAMUSCULAR | Status: DC

## 2018-11-25 MED ORDER — MEDROXYPROGESTERONE ACETATE 150 MG/ML IM SUSY
150.0000 mg | PREFILLED_SYRINGE | Freq: Once | INTRAMUSCULAR | Status: AC
  Administered 2018-11-25: 150 mg via INTRAMUSCULAR

## 2018-11-25 NOTE — Progress Notes (Signed)
Pt is here today for a depo.  She was due by 11/12/2018.    Last contraceptive appt was: 2019. Has upcoming physical in 3 weeks  Pt has not been sexually active in the past 2 weeks. Her upreg today is negative.   No need to return to clinic for repeat upreg.  Pt given injection today in her RUOQ, tolerated well.  She is due for next depo 02/09/18 - 02/23/18. Reminder card given.   Christen Bame, CMA

## 2018-12-07 ENCOUNTER — Other Ambulatory Visit: Payer: Self-pay | Admitting: Family Medicine

## 2018-12-07 DIAGNOSIS — I10 Essential (primary) hypertension: Secondary | ICD-10-CM

## 2018-12-07 NOTE — Telephone Encounter (Signed)
Pt has an appt on 12/13/2018. Jazmin Hartsell,CMA

## 2018-12-07 NOTE — Telephone Encounter (Signed)
Patient needs appointment to follow up BP and anxiety. Refill sent.

## 2018-12-10 NOTE — Progress Notes (Signed)
Subjective:   Patient ID: Amy Miller    DOB: Jan 13, 1976, 43 y.o. female   MRN: [id number]  Amy Miller is a 43 y.o. female with a history of HTN, migraine, GERD, HSV, morbid obesity, anxiety/depression here for   Need for pap smear - G2P1011 - Menses: none - Contraception: depo - Cancer screening: Negative Pap w/ -HPV 2015 - Denies abnormal vaginal discharge, intermenstrual bleeding  Healthcare Maintenance - Vaccines: flu, Tdap - Pap Smear: due - Lipid Panel: due  Post viral asthma - Previously seen 08/02/2018 for shortness of breath and wheezing, that time had been going on for 3 months, thought to be related to post viral asthma given Covid infection in June.  Started on albuterol inhaler. - using albuterol once in the past 2 weeks for chest heaviness (changing of weather and allergies) - Denies fevers, shortness of breath, coughing, chest pain, sick contacts - wheezes about once per week since weather changed.  - Family history of asthma and a niece. - Is a non-smoker.  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  Medications and smoking status reviewed.  Objective:   BP (!) 120/56    Pulse (!) 108    Ht 5' (1.524 m)    Wt 247 lb 8 oz (112.3 kg)    SpO2 98%    BMI 48.34 kg/m  Vitals and nursing note reviewed.  General: Morbidly obese female, in no acute distress with non-toxic appearance CV: regular rate and rhythm without murmurs, rubs, or gallops, no lower extremity edema Lungs: clear to auscultation bilaterally with normal work of breathing.  No wheezing. Breasts: breasts appear normal, no suspicious masses, no  skin or nipple changes or axillary nodes. GYN:  External genitalia within normal limits.  Vaginal mucosa pink, moist, normal rugae.  Nonfriable cervix without lesions, no discharge or bleeding noted on speculum exam.  Bimanual exam revealed normal, nongravid uterus.  No cervical motion tenderness. No adnexal masses bilaterally.   Psych: well groomed.  Became tearful when discussing anxiety and depression.  Assessment & Plan:   Post viral asthma Doing well not having to use albuterol as much, using about once every couple weeks.  Lung exam within normal limits today.  Given no prior history of obstructive lung disease and classic nighttime symptoms, would recommend PFTs, referral placed.  Continue with as needed albuterol.  Discussed reasons to present for emergent care such as difficulty breathing and chest pain.  Patient verbalized understanding.  Depression Worsened, PHQ-9 17 today with marked effect on daily life.  No SI.  Does have good coping mechanisms but with worsened control, will start additional buspirone.  Provided patient with counseling resources.  Follow-up in 1 month.  Preventative health care Pap smear and lipid panel obtained today, will call with results.  Orders Placed This Encounter  Procedures   Lipid Panel   Ambulatory referral to Pulmonology    Referral Priority:   Routine    Referral Type:   Consultation    Referral Reason:   Specialty Services Required    Requested Specialty:   Pulmonary Disease    Number of Visits Requested:   1   Meds ordered this  encounter  Medications   busPIRone (BUSPAR) 5 MG tablet    Sig: Take 1 tablet (5 mg total) by mouth 2 (two) times daily.    Dispense:  60 tablet    Refill:  1   labetalol (NORMODYNE) 100 MG tablet    Sig: TAKE 1 TABLET BY MOUTH ONCE DAILY    Dispense:  90 tablet    Refill:  2    [place] Dense, DO PGY-3, [facility] 12/13/2018 12:12 PM

## 2018-12-13 ENCOUNTER — Other Ambulatory Visit: Payer: Self-pay

## 2018-12-13 ENCOUNTER — Ambulatory Visit (INDEPENDENT_AMBULATORY_CARE_PROVIDER_SITE_OTHER): Payer: BC Managed Care – PPO | Admitting: Family Medicine

## 2018-12-13 ENCOUNTER — Encounter: Payer: Self-pay | Admitting: Family Medicine

## 2018-12-13 ENCOUNTER — Other Ambulatory Visit (HOSPITAL_COMMUNITY)
Admission: RE | Admit: 2018-12-13 | Discharge: 2018-12-13 | Disposition: A | Discharge status: Home / Self Care | Payer: BC Managed Care – PPO | Source: Ambulatory Visit | Attending: Family Medicine | Admitting: Family Medicine

## 2018-12-13 VITALS — BP 120/56 | HR 108 | Ht 60.0 in | Wt 247.5 lb

## 2018-12-13 DIAGNOSIS — Z0000 Encounter for general adult medical examination without abnormal findings: Secondary | ICD-10-CM

## 2018-12-13 DIAGNOSIS — I10 Essential (primary) hypertension: Secondary | ICD-10-CM | POA: Diagnosis not present

## 2018-12-13 DIAGNOSIS — F3289 Other specified depressive episodes: Secondary | ICD-10-CM

## 2018-12-13 DIAGNOSIS — J45998 Other asthma: Secondary | ICD-10-CM

## 2018-12-13 MED ORDER — BUSPIRONE HCL 5 MG PO TABS: 5.0000 mg | ORAL_TABLET | Freq: Two times a day (BID) | ORAL | 1 refills | Status: DC

## 2018-12-13 MED ORDER — LABETALOL HCL 100 MG PO TABS: ORAL_TABLET | ORAL | 2 refills | Status: DC

## 2018-12-13 NOTE — Assessment & Plan Note (Signed)
Pap smear and lipid panel obtained today, will call with results.

## 2018-12-13 NOTE — Addendum Note (Signed)
Addended by: Myles Gip on: 12/13/2018 04:02 PM   Modules accepted: Orders

## 2018-12-13 NOTE — Assessment & Plan Note (Signed)
Worsened, PHQ-9 17 today with marked effect on daily life.  No SI.  Does have good coping mechanisms but with worsened control, will start additional buspirone.  Provided patient with counseling resources.  Follow-up in 1 month.

## 2018-12-13 NOTE — Assessment & Plan Note (Signed)
Doing well not having to use albuterol as much, using about once every couple weeks.  Lung exam within normal limits today.  Given no prior history of obstructive lung disease and classic nighttime symptoms, would recommend PFTs, referral placed.  Continue with as needed albuterol.  Discussed reasons to present for emergent care such as difficulty breathing and chest pain.  Patient verbalized understanding.

## 2018-12-13 NOTE — Patient Instructions (Signed)
It was great to see you!  Our plans for today:  - We are starting a new medication for your anxiety called buspirone.  Take this with your sertraline.  See below for recommendations for counselors. - Refilled your labetalol. - We will call you with the results of your Pap smear. - Come back in 4 weeks.  Take care and seek immediate care sooner if you develop any concerns.   Dr. [facility]  Therapy and Counseling Resources Most providers on this list will take Medicaid. Patients with commercial insurance or Medicare should contact their insurance company to get a list of in network providers.  [facility] [address]  [place], [place] [address]       [phone]     [facility] [address], [place], [place]  [phone]    [facility] [address], [place], [place] [phone]  [facility]:  [address] [place] [phone]  [facility]:  [address] [place], [place] [phone]  [facility] (under & uninsured) [address]   [place] [phone]    [email or web address]    [facility] [place] -[address]     Phone:  [phone]     [facility]-  [address]  [phone]   [facility] [address]      [place], [place] [phone]  [facility]: [address] [place], [place], [place]  [phone]   [facility] (Spanish therapist) [address] [place]  [address]   [place] [address]    [phone]    [facility]   [address],  [place], [place]  [phone]   [facility]  [address] [place]  [phone]  [facility]  [address] [place], [place]        [phone]  [facility] In Clinic under & uninsured [place],   [address], [place] [phone]):  Mon - Fri from 8 AM - 3 PM  [facility] the [address],  (Spanish)   [address], [place]: [phone]) 8:30 - 12; 1 - 2:30  [facility] of the [employer],  [address] [place], [place]    [phone]):8:30 - 12; 2 - 3PM  [facility] [employer],  [address],  [place]; [phone]):   Mon - Fri 8 AM - 5 PM  [facility] [address] [place]  MWF 12:30 to 3:00 or call to schedule an appointment  [phone]  Specific Provider options Psychology Today  [email or web address] 1. click on find a therapist  2. enter your zip code 3. left side and select or tailor a therapist for your specific need.   [facility] Provider Directory [email or web address]  (Medicaid)   Follow all drop down to find a provider  Social Support program Mental Health [place] [phone] or [employer]pl [address], [place], [place] Recovery support and educational   In home counseling [facility] Telephone: [phone]  office in [place] [email or web address]   Does not take reg. Medicaid or Medicare private insurance BCCS, [place] health Choice, [facility], [place], [place], [place], [place] Health Choice  24- Hour Availability:  . [facility]   [phone] or [phone]  . Family Service of the [place]  Crisis Line [phone]  [place] [facility]  [phone]   . [place]  [phone] (after hours)  . Therapeutic Alternative/Mobile Crisis   8561281300  . [place] National Suicide Hotline  7254215653 ([place])  . Call 911 or go to emergency room  . [employer]  [phone]);  [place] and [employer]   . [facility] ACCESS  [phone]); [place], [place], [place], [place], [place], [place], [place]

## 2018-12-14 LAB — LIPID PANEL
Chol/HDL Ratio: 4.2 ratio (ref 0.0–4.4)
Cholesterol, Total: 197 mg/dL (ref 100–199)
HDL: 47 mg/dL (ref >39)
LDL Chol Calc (NIH): 121 mg/dL — ABNORMAL HIGH (ref 0–99)
Triglycerides: 163 mg/dL — ABNORMAL HIGH (ref 0–149)
VLDL Cholesterol Cal: 29 mg/dL (ref 5–40)

## 2018-12-15 LAB — CYTOLOGY - PAP
Adequacy: ABSENT
Chlamydia: NEGATIVE
Comment: NEGATIVE
Comment: NEGATIVE
Comment: NORMAL
Diagnosis: NEGATIVE
High risk HPV: NEGATIVE
Neisseria Gonorrhea: NEGATIVE

## 2019-01-31 ENCOUNTER — Other Ambulatory Visit: Payer: Self-pay | Admitting: Family Medicine

## 2019-01-31 DIAGNOSIS — J302 Other seasonal allergic rhinitis: Secondary | ICD-10-CM

## 2019-02-22 ENCOUNTER — Ambulatory Visit (INDEPENDENT_AMBULATORY_CARE_PROVIDER_SITE_OTHER): Payer: BC Managed Care – PPO | Admitting: Family Medicine

## 2019-02-22 ENCOUNTER — Encounter: Payer: Self-pay | Admitting: Family Medicine

## 2019-02-22 ENCOUNTER — Other Ambulatory Visit: Payer: Self-pay

## 2019-02-22 VITALS — BP 124/78 | HR 96 | Wt 253.0 lb

## 2019-02-22 DIAGNOSIS — B029 Zoster without complications: Secondary | ICD-10-CM

## 2019-02-22 DIAGNOSIS — F419 Anxiety disorder, unspecified: Secondary | ICD-10-CM

## 2019-02-22 DIAGNOSIS — F329 Major depressive disorder, single episode, unspecified: Secondary | ICD-10-CM | POA: Diagnosis not present

## 2019-02-22 DIAGNOSIS — Z3042 Encounter for surveillance of injectable contraceptive: Secondary | ICD-10-CM | POA: Diagnosis not present

## 2019-02-22 DIAGNOSIS — F32A Depression, unspecified: Secondary | ICD-10-CM

## 2019-02-22 DIAGNOSIS — R062 Wheezing: Secondary | ICD-10-CM

## 2019-02-22 MED ORDER — ACYCLOVIR 400 MG PO TABS: 400.0000 mg | ORAL_TABLET | Freq: Three times a day (TID) | ORAL | 0 refills | Status: DC

## 2019-02-22 MED ORDER — MEDROXYPROGESTERONE ACETATE 150 MG/ML IM SUSY
150.0000 mg | PREFILLED_SYRINGE | INTRAMUSCULAR | Status: AC
  Administered 2019-02-22: 150 mg via INTRAMUSCULAR

## 2019-02-22 MED ORDER — ALBUTEROL SULFATE HFA 108 (90 BASE) MCG/ACT IN AERS: 1.0000 | INHALATION_SPRAY | Freq: Four times a day (QID) | RESPIRATORY_TRACT | 1 refills | Status: AC | PRN

## 2019-02-22 MED ORDER — SERTRALINE HCL 25 MG PO TABS: 25.0000 mg | ORAL_TABLET | Freq: Every day | ORAL | 0 refills | Status: DC

## 2019-02-22 NOTE — Progress Notes (Signed)
   CHIEF COMPLAINT / HPI:  Anxiety/depression - Medications: Sertraline 75 mg, buspirone 5 mg BID - Taking: stopped sertraline after starting buspirone. - Current stressors: work (middle school principal) and weight, needs to lose about 40-50 lbs for knee surgery but stress eats so is difficult. - Coping Mechanisms: sleeping, going on outings with daughter. - energy level has been better since starting buspar, mood is not better.  Obesity - is monitoring her diet. Trying to eat more lean meats and vegetables. She does eat a lot of sweets and often stress eats when she is anxious. - Needs to lose 40-50lbs for knee surgery. - interested in nutrition referral.   PERTINENT  PMH / PSH: HTN, migraine, GERD, morbid obesity, depression  Depression screen [facility] 2/9 02/22/2019 12/13/2018 08/02/2018  Decreased Interest 1 3 0  Down, Depressed, Hopeless 1 3 0  PHQ - 2 Score 2 6 0  Altered sleeping 3 3 -  Tired, decreased energy 2 3 -  Change in appetite 1 2 -  Feeling bad or failure about yourself  1 1 -  Trouble concentrating 1 1 -  Moving slowly or fidgety/restless 1 1 -  Suicidal thoughts 0 0 -  PHQ-9 Score 11 17 -  Difficult doing work/chores Somewhat difficult Extremely dIfficult -   GAD 7 : Generalized Anxiety Score 02/22/2019 02/22/2019 02/11/2018  Nervous, Anxious, on Edge 1 1 3   Control/stop worrying 1 1 3   Worry too much - different things 1 1 2   Trouble relaxing 2 2 3   Restless 1 1 3   Easily annoyed or irritable 2 2 3   Afraid - awful might happen 0 0 1  Total GAD 7 Score 8 8 18   Anxiety Difficulty Somewhat difficult Somewhat difficult Extremely difficult    OBJECTIVE: BP 124/78   Pulse 96   Wt 253 lb (114.8 kg)   SpO2 98%   BMI 49.41 kg/m   General: Morbidly obese female, in no acute distress with non-toxic appearance CV: regular rate and rhythm without murmurs, rubs, or gallops Lungs: clear to auscultation bilaterally with normal work of breathing Psych: Tearful when  discussing anxiety.  Appropriately dressed and well-groomed.  No flight of ideas or tangential thought process.  ASSESSMENT / PLAN:  Anxiety Improved with addition of BuSpar however patient could not continue taking sertraline.  Gad improved from 18 to 8 today but still with significant impact on daily life.  Will add back low-dose sertraline to regimen.  Strongly encouraged counseling especially given history of stress eating, resources provided.  Follow-up in 1 month.  OBESITY, MORBID Counseling provided on diet and low impact exercises, see AVS for details.  Interested in nutrition referral, referral placed today and information provided.     , DO [facility]

## 2019-02-22 NOTE — Patient Instructions (Signed)
It was great to see you!  Our plans for today:  - I refilled your albuterol and acyclovir. - We are restarting the sertraline at a lower dose. - See below for counseling resources. - Try low impact exercises. See below for tips on healthy eating. - Please call our nutritionist, Dr. Gerilyn Pilgrim, to set up an appointment. [phone]. - Come back in 1 month for follow up.   Take care and seek immediate care sooner if you develop any concerns.   Dr. [facility]  Here is an example of what a healthy plate looks like:    ? Make half your plate fruits and vegetables.     ? Focus on whole fruits.     ? Vary your veggies.  ? Make half your grains whole grains. -     ? Look for the word "whole" at the beginning of the ingredients list    ? Some whole-grain ingredients include whole oats, whole-wheat flour,        whole-grain corn, whole-grain brown rice, and whole rye.  ? Move to low-fat and fat-free milk or yogurt.  ? Vary your protein routine. - Meat, fish, poultry (chicken, [place]), eggs, beans (kidney, pinto), dairy.  ? Drink and eat less sodium, saturated fat, and added sugars.   Therapy and Counseling Resources Most providers on this list will take Medicaid. Patients with commercial insurance or Medicare should contact their insurance company to get a list of in network providers.  [facility] [address]  [place], [place] [address]       [phone]     [facility] [address] [place], [place]  [phone]    [facility] [address] [place], [place] [phone]  [facility]:  [address] [place] [phone]  [facility]:  [address] [place], [place] [phone]  [facility] (under & uninsured) [address]   [place] [phone]    [email or web address]    [facility] [place] -[address]     Phone:  [phone]     [facility]-  [address]  [phone]   [facility] [address]      [place], [place] [phone]  [facility]: [address] [place], [place], [place]  [phone]   [facility] (Spanish therapist) [address] [place]  [address]   [place] [address]    [phone]    [facility]   [address],  [place], [place]  [phone]   [facility]  [address] [place]  [phone]  [facility]  [address] [place], [place]        [phone]  Open Access[facility]Walk In Clinic under & uninsured [place],  [address] [place] [phone]):  Mon - Fri from 8 AM - 3 PM  [facility] of the [address],  (Spanish)   [address] [place], [place]: [phone]) 8:30 - 12; 1 - 2:30  [facility] of the [employer],  [address] [place], [place]    ([phone]):8:30 - 12; 2 - 3PM  [facility] [employer],  [address],  [place]; [phone]):   Mon - Fri 8 AM - 5 PM  [facility] [address] [place]  MWF 12:30  to 3:00 or call to schedule an appointment  [phone]  Specific Provider options Psychology Today  [email or web address] 1. click on find a therapist  2. enter your zip code 3. left side and select or tailor a therapist for your specific need.   [facility] Provider Directory [email or web address]  (Medicaid)   Follow all drop down to find a provider  [place] or [email or web address] [address], [place], [place] Recovery support and educational   In home counseling [place] Telephone: [phone]  office in [employer] [email or web address]   Does not take reg. Medicaid or  Medicare private insurance BCCS, [place] health Choice, [facility], [place], [place], [place], [place] Health Choice  24- Hour Availability:  . [place] or [phone]  . Family Service of the [employer] [phone]  [facility] Crisis Service  [phone]   . [place]  [phone] (after hours)  . Therapeutic Alternative/Mobile Crisis   [phone]  . [place] National Suicide Hotline  (831) 069-7678 ([place])  . Call 911 or go to emergency room  . [employer]  [phone]);  [place] and [employer]   . [facility] ACCESS  [phone]); [place], [place], [place], [place], [place], [place], [place]

## 2019-02-22 NOTE — Assessment & Plan Note (Signed)
Counseling provided on diet and low impact exercises, see AVS for details.  Interested in nutrition referral, referral placed today and information provided.

## 2019-02-22 NOTE — Assessment & Plan Note (Addendum)
Improved with addition of BuSpar however patient could not continue taking sertraline.  Gad improved from 18 to 8 today but still with significant impact on daily life.  Will add back low-dose sertraline to regimen.  Strongly encouraged counseling especially given history of stress eating, resources provided.  Follow-up in 1 month.

## 2019-04-05 ENCOUNTER — Other Ambulatory Visit: Payer: Self-pay | Admitting: Family Medicine

## 2019-07-08 ENCOUNTER — Ambulatory Visit (INDEPENDENT_AMBULATORY_CARE_PROVIDER_SITE_OTHER): Payer: BC Managed Care – PPO

## 2019-07-08 ENCOUNTER — Other Ambulatory Visit: Payer: Self-pay

## 2019-07-08 DIAGNOSIS — Z3042 Encounter for surveillance of injectable contraceptive: Secondary | ICD-10-CM | POA: Diagnosis not present

## 2019-07-08 LAB — POCT URINE PREGNANCY: Preg Test, Ur: NEGATIVE

## 2019-07-08 MED ORDER — MEDROXYPROGESTERONE ACETATE 150 MG/ML IM SUSP
150.0000 mg | Freq: Once | INTRAMUSCULAR | Status: AC
  Administered 2019-07-08: 150 mg via INTRAMUSCULAR

## 2019-07-08 NOTE — Progress Notes (Signed)
Patient presents in nurse clinic for depo provera injection. Patient is not within her dates. Urine pregnancy obtained and negative. Patient reports she has not been sexually active in over one year.   Depo provera given RUOQ, site unremarkable. Patient to return to nurse clinic for next injection 09/23/2019-10/07/2019. Reminder card given.

## 2019-07-17 ENCOUNTER — Other Ambulatory Visit: Payer: Self-pay | Admitting: Family Medicine

## 2019-07-17 DIAGNOSIS — J302 Other seasonal allergic rhinitis: Secondary | ICD-10-CM

## 2019-08-27 ENCOUNTER — Other Ambulatory Visit: Payer: Self-pay | Admitting: Family Medicine

## 2019-10-17 ENCOUNTER — Other Ambulatory Visit: Payer: Self-pay

## 2019-10-17 DIAGNOSIS — I10 Essential (primary) hypertension: Secondary | ICD-10-CM

## 2019-10-17 MED ORDER — LABETALOL HCL 100 MG PO TABS: ORAL_TABLET | ORAL | 2 refills | Status: DC

## 2019-10-21 ENCOUNTER — Other Ambulatory Visit: Payer: Self-pay | Admitting: Family Medicine

## 2019-10-21 DIAGNOSIS — F32A Depression, unspecified: Secondary | ICD-10-CM

## 2019-10-21 DIAGNOSIS — J302 Other seasonal allergic rhinitis: Secondary | ICD-10-CM

## 2019-10-31 ENCOUNTER — Ambulatory Visit (INDEPENDENT_AMBULATORY_CARE_PROVIDER_SITE_OTHER): Payer: BC Managed Care – PPO | Admitting: Family Medicine

## 2019-10-31 ENCOUNTER — Other Ambulatory Visit: Payer: Self-pay

## 2019-10-31 ENCOUNTER — Encounter: Payer: Self-pay | Admitting: Family Medicine

## 2019-10-31 VITALS — BP 110/60 | HR 93 | Ht 60.0 in | Wt 232.1 lb

## 2019-10-31 DIAGNOSIS — F32A Depression, unspecified: Secondary | ICD-10-CM

## 2019-10-31 DIAGNOSIS — Z789 Other specified health status: Secondary | ICD-10-CM | POA: Diagnosis not present

## 2019-10-31 DIAGNOSIS — Z23 Encounter for immunization: Secondary | ICD-10-CM | POA: Diagnosis not present

## 2019-10-31 DIAGNOSIS — Z1159 Encounter for screening for other viral diseases: Secondary | ICD-10-CM

## 2019-10-31 DIAGNOSIS — J302 Other seasonal allergic rhinitis: Secondary | ICD-10-CM

## 2019-10-31 DIAGNOSIS — F419 Anxiety disorder, unspecified: Secondary | ICD-10-CM

## 2019-10-31 LAB — POCT URINE PREGNANCY: Preg Test, Ur: NEGATIVE

## 2019-10-31 MED ORDER — MEDROXYPROGESTERONE ACETATE 150 MG/ML IM SUSP
150.0000 mg | Freq: Once | INTRAMUSCULAR | Status: AC
  Administered 2019-10-31: 150 mg via INTRAMUSCULAR

## 2019-10-31 MED ORDER — FLUTICASONE PROPIONATE 50 MCG/ACT NA SUSP: NASAL | 0 refills | Status: DC

## 2019-10-31 MED ORDER — BUSPIRONE HCL 5 MG PO TABS: 5.0000 mg | ORAL_TABLET | Freq: Two times a day (BID) | ORAL | 0 refills | Status: DC

## 2019-10-31 MED ORDER — SERTRALINE HCL 25 MG PO TABS: 25.0000 mg | ORAL_TABLET | Freq: Every day | ORAL | 0 refills | Status: DC

## 2019-10-31 NOTE — Patient Instructions (Signed)
It was nice meeting you today.   Please check-out at the front desk before leaving the clinic. I'd like to see you back in 2 weeks. Consider visiting the "psychology today" website and picking a therapist.   Visit Remembers: - Stop by the pharmacy to pick up your prescriptions  - Medicine Changes: Restart Sertaline    Taking the medicine as directed and not missing any doses is one of the best things you can do to treat your depression.  Here are some things to keep in mind:  1) Side effects (stomach upset, some increased anxiety) may happen before you notice a benefit.  These side effects typically go away over time. 2) Changes to your dose of medicine or a change in medication all together is sometimes necessary 3) Most people need to be on medication at least 6-12 months 4) Many people will notice an improvement within two weeks but the full effect of the medication can take up to 4-6 weeks 5) Stopping the medication when you start feeling better often results in a return of symptoms 6) If you start having thoughts of hurting yourself or others after starting this medicine, please call me at [phone] immediately.    Feel free to call with any questions or concerns at any time, at [phone].    If you are feeling suicidal or depression symptoms worsen please immediately go to:   24 Hour Availability [facility]  [address] [place], [place] [phone] Crisis [phone]  . If you are thinking about harming yourself or having thoughts of suicide, or if you know someone who is, seek help right away. . Call your doctor or mental health care provider. . Call 911 or go to a hospital emergency room to get immediate help, or ask a friend or family member to help you do these things. . Call the [place] National Suicide Prevention Lifeline's toll-free, 24-hour hotline at 1-800-273-TALK (717) 186-9668) or TTY: 1-800-799-4 TTY 920-625-7355) to talk to  a trained counselor. . If you are in crisis, make sure you are not left alone.  . If someone else is in crisis, make sure he or she is not left alone   Take care,  Dr. Katherina [facility]

## 2019-10-31 NOTE — Assessment & Plan Note (Addendum)
Uncontrolled. Encouraged counseling. Reviewed Psychology today website with pt. Restart Zoloft. Continue Buspar. Has not had a therapist in the past. Feels as if anxiety causing her to miss work. Onset of sx worsens on Sunday night. Patient is a Museum/gallery curator. It appears she has been missed work 2/2 anxiety.  Patient requesting FMLA or work excuse for anxiety. Asked pt to bring paperwork by for review. She states her anxiety is "well documented" in her chart. Requested time to thoroughly review her chart. Asked pt to make follow up to discuss changes in anxiety on medication and plan going forward.   GAD 7 : Generalized Anxiety Score 10/31/2019 02/22/2019 02/22/2019 02/11/2018  Nervous, Anxious, on Edge 3 1 1 3   Control/stop worrying 3 1 1 3   Worry too much - different things 3 1 1 2   Trouble relaxing 3 2 2 3   Restless 2 1 1 3   Easily annoyed or irritable 3 2 2 3   Afraid - awful might happen 2 0 0 1  Total GAD 7 Score 19 8 8 18   Anxiety Difficulty Extremely difficult Somewhat difficult Somewhat difficult Extremely difficult     Office Visit from 10/31/2019 in [facility] Office Visit from 02/22/2019 in [facility] Office Visit from 12/13/2018 in [facility]  Thoughts that you would be better off dead, or of hurting yourself in some way Not at all Not at all Not at all  PHQ-9 Total Score 20 11 17
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
## 2019-10-31 NOTE — Assessment & Plan Note (Addendum)
Pregnancy test negative. Depo injection today. Follow up in 3 months (timeframe of return discussed with pt)

## 2019-10-31 NOTE — Progress Notes (Signed)
Patient had Urine Preg. Done for depo shot. Negative results. Depo shot was given today in [facility]. Next Depo shot is due between Dec-27-Jan 10th, 2021 on [facility].

## 2019-11-22 ENCOUNTER — Other Ambulatory Visit: Payer: Self-pay

## 2019-11-22 MED ORDER — DICLOFENAC SODIUM 75 MG PO TBEC: 75.0000 mg | DELAYED_RELEASE_TABLET | Freq: Two times a day (BID) | ORAL | 0 refills | Status: DC

## 2019-11-25 ENCOUNTER — Other Ambulatory Visit: Payer: Self-pay | Admitting: Family Medicine

## 2019-11-25 DIAGNOSIS — B029 Zoster without complications: Secondary | ICD-10-CM

## 2019-12-23 ENCOUNTER — Other Ambulatory Visit: Payer: Self-pay | Admitting: Family Medicine

## 2020-01-08 ENCOUNTER — Other Ambulatory Visit: Payer: Self-pay | Admitting: Family Medicine

## 2020-01-08 DIAGNOSIS — J302 Other seasonal allergic rhinitis: Secondary | ICD-10-CM

## 2020-01-09 ENCOUNTER — Other Ambulatory Visit: Payer: Self-pay

## 2020-01-09 DIAGNOSIS — J302 Other seasonal allergic rhinitis: Secondary | ICD-10-CM

## 2020-01-09 DIAGNOSIS — B029 Zoster without complications: Secondary | ICD-10-CM

## 2020-01-09 MED ORDER — ACYCLOVIR 400 MG PO TABS: 400.0000 mg | ORAL_TABLET | Freq: Three times a day (TID) | ORAL | 0 refills | Status: DC

## 2020-01-09 MED ORDER — FLUTICASONE PROPIONATE 50 MCG/ACT NA SUSP: NASAL | 0 refills | Status: DC

## 2020-02-07 ENCOUNTER — Other Ambulatory Visit: Payer: Self-pay | Admitting: Family Medicine

## 2020-02-07 DIAGNOSIS — F32A Depression, unspecified: Secondary | ICD-10-CM

## 2020-02-15 ENCOUNTER — Other Ambulatory Visit: Payer: Self-pay | Admitting: Family Medicine

## 2020-02-29 ENCOUNTER — Other Ambulatory Visit: Payer: Self-pay | Admitting: Family Medicine

## 2020-02-29 DIAGNOSIS — F32A Depression, unspecified: Secondary | ICD-10-CM

## 2020-03-07 ENCOUNTER — Ambulatory Visit: Payer: BC Managed Care – PPO

## 2020-03-11 ENCOUNTER — Other Ambulatory Visit: Payer: Self-pay | Admitting: Family Medicine

## 2020-03-11 DIAGNOSIS — J302 Other seasonal allergic rhinitis: Secondary | ICD-10-CM

## 2020-03-14 ENCOUNTER — Ambulatory Visit (INDEPENDENT_AMBULATORY_CARE_PROVIDER_SITE_OTHER): Payer: BC Managed Care – PPO

## 2020-03-14 ENCOUNTER — Other Ambulatory Visit: Payer: Self-pay

## 2020-03-14 DIAGNOSIS — Z3042 Encounter for surveillance of injectable contraceptive: Secondary | ICD-10-CM

## 2020-03-14 LAB — POCT URINE PREGNANCY: Preg Test, Ur: NEGATIVE

## 2020-03-14 MED ORDER — MEDROXYPROGESTERONE ACETATE 150 MG/ML IM SUSP
150.0000 mg | Freq: Once | INTRAMUSCULAR | Status: AC
  Administered 2020-03-14: 150 mg via INTRAMUSCULAR

## 2020-03-14 NOTE — Progress Notes (Signed)
Patient here today for Depo Provera injection and is not within her dates. Urine pregnancy obtained with negative result. Patient denies being sexually active within the last month.     Last contraceptive appt was 10/2019  Depo given in [facility] today.  Site unremarkable & patient tolerated injection.    Next injection due 5/11-5/25.  Reminder card given.    Veronda Prude, RN

## 2020-05-03 ENCOUNTER — Other Ambulatory Visit: Payer: Self-pay | Admitting: Family Medicine

## 2020-05-03 DIAGNOSIS — F32A Depression, unspecified: Secondary | ICD-10-CM

## 2020-05-10 ENCOUNTER — Other Ambulatory Visit: Payer: Self-pay | Admitting: Family Medicine

## 2020-05-10 DIAGNOSIS — J302 Other seasonal allergic rhinitis: Secondary | ICD-10-CM

## 2020-06-02 IMAGING — CR DG LUMBAR SPINE 2-3V
2 series · 2 of 2 positions shown · non-contrast
Comparison: None.

CLINICAL DATA: Chronic right low back pain for the past 2 years. No
known injury. The pain radiates to the right knee.

EXAM:
LUMBAR SPINE - 2-3 VIEW

[w lumbar spine ap]
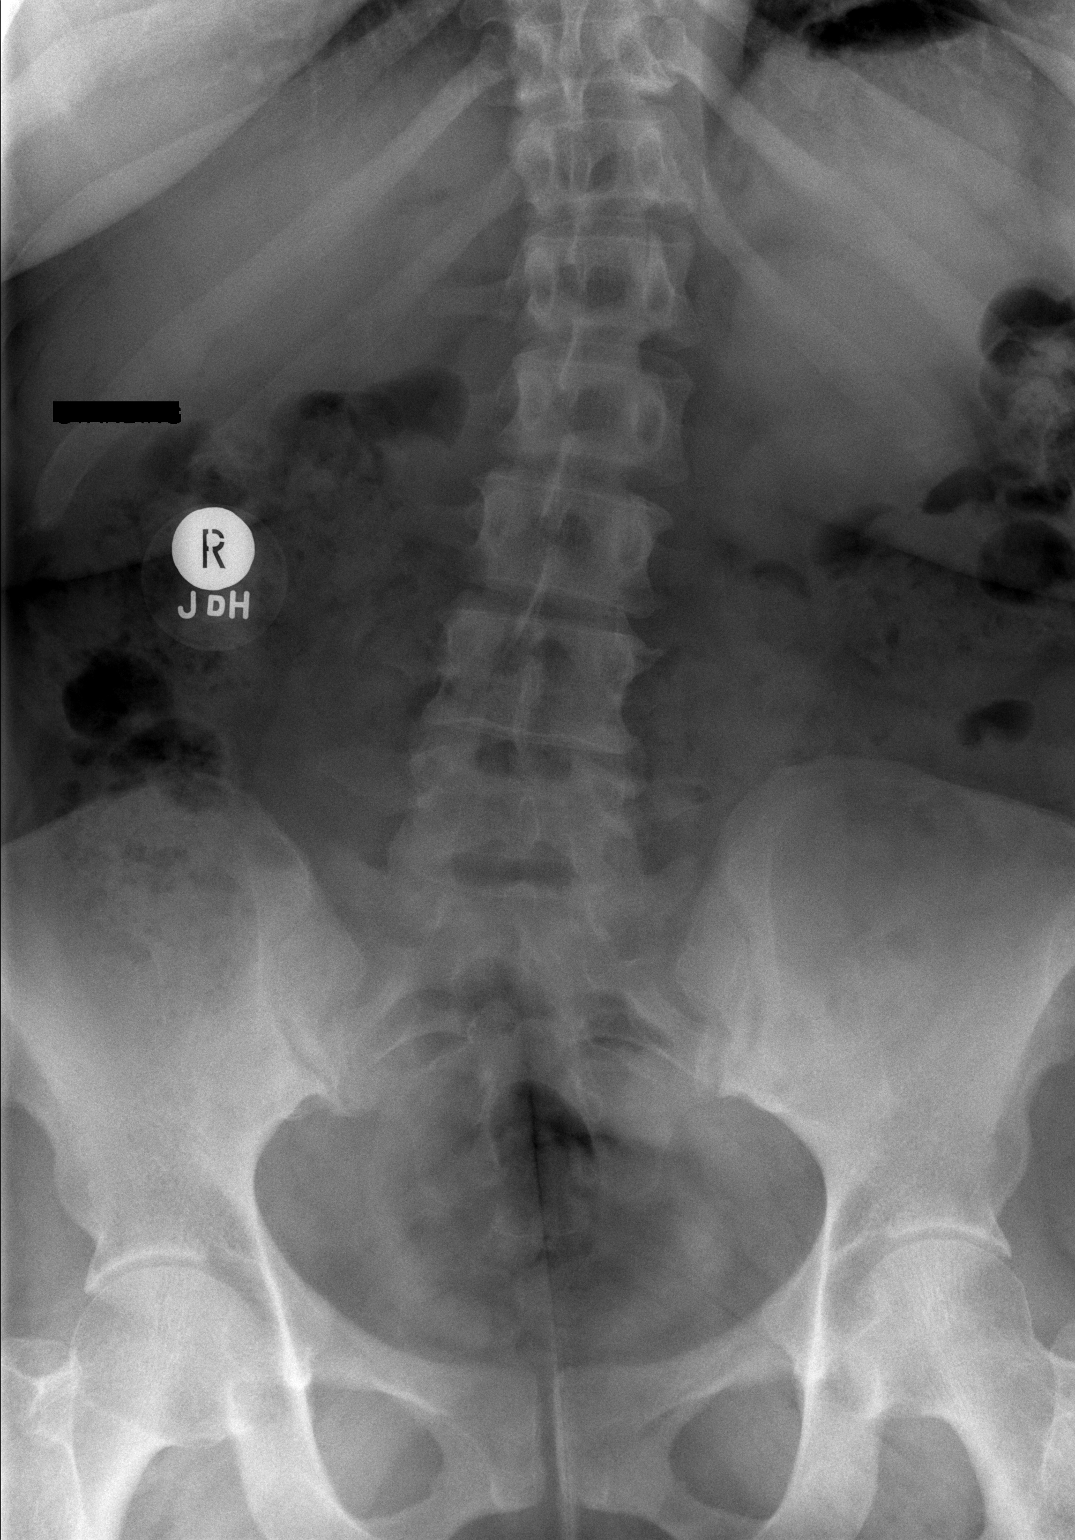

[w lumbar spine lat]
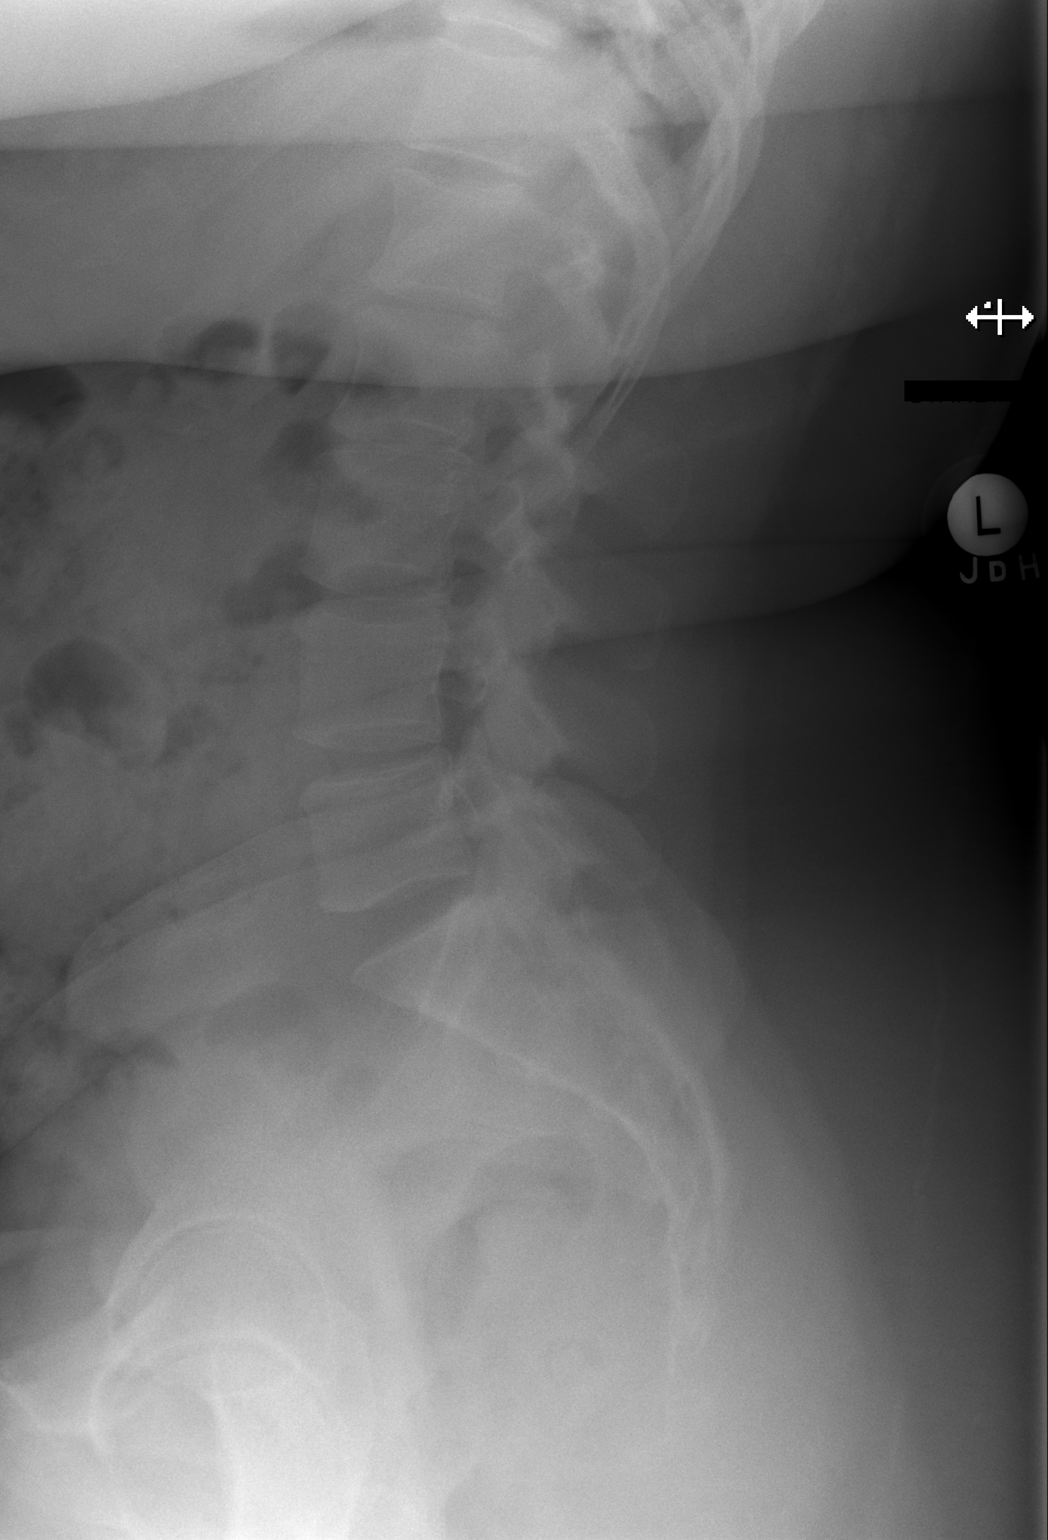

[2 of 2 positions shown; findings below may reference images not displayed]

FINDINGS: AP and lateral views of the lumbar spine are limited by a lack of
collimation. There are 5 non-rib-bearing lumbar vertebrae. Mild
levoconvex thoracolumbar rotary scoliosis. Mild anterior and lateral
spur formation at multiple levels. Facet degenerative changes at
multiple levels. No fractures, pars defects or subluxations.
IMPRESSION: Scoliosis and degenerative changes, as described above.

## 2020-06-21 ENCOUNTER — Other Ambulatory Visit: Payer: Self-pay

## 2020-06-21 ENCOUNTER — Ambulatory Visit (INDEPENDENT_AMBULATORY_CARE_PROVIDER_SITE_OTHER): Payer: BC Managed Care – PPO

## 2020-06-21 DIAGNOSIS — Z3042 Encounter for surveillance of injectable contraceptive: Secondary | ICD-10-CM | POA: Diagnosis not present

## 2020-06-21 LAB — POCT URINE PREGNANCY: Preg Test, Ur: NEGATIVE

## 2020-06-25 MED ORDER — MEDROXYPROGESTERONE ACETATE 150 MG/ML IM SUSP
150.0000 mg | Freq: Once | INTRAMUSCULAR | Status: AC
  Administered 2020-06-21: 150 mg via INTRAMUSCULAR

## 2020-06-25 NOTE — Progress Notes (Signed)
Patient here today for Depo Provera injection and is not within her dates.  Urine pregnancy obtained and was negative. Patient denies being sexually active in the last 30 days.   Last contraceptive appt was 10/2019  Depo given in [facility] today.  Site unremarkable & patient tolerated injection.    Next injection due 8/18-09/20/20.  Reminder card given.    Veronda Prude, RN

## 2020-07-09 ENCOUNTER — Other Ambulatory Visit: Payer: Self-pay | Admitting: Family Medicine

## 2020-07-09 DIAGNOSIS — F32A Depression, unspecified: Secondary | ICD-10-CM

## 2020-07-09 DIAGNOSIS — B029 Zoster without complications: Secondary | ICD-10-CM

## 2020-08-01 ENCOUNTER — Other Ambulatory Visit: Payer: Self-pay | Admitting: Family Medicine

## 2020-08-01 DIAGNOSIS — I10 Essential (primary) hypertension: Secondary | ICD-10-CM

## 2020-08-01 NOTE — Telephone Encounter (Signed)
Needs a follow up appointment. 30-d Rx sent to pharmacy

## 2020-08-29 ENCOUNTER — Other Ambulatory Visit: Payer: Self-pay | Admitting: Family Medicine

## 2020-08-29 DIAGNOSIS — F32A Depression, unspecified: Secondary | ICD-10-CM

## 2020-09-03 ENCOUNTER — Other Ambulatory Visit: Payer: Self-pay | Admitting: Family Medicine

## 2020-09-03 DIAGNOSIS — I10 Essential (primary) hypertension: Secondary | ICD-10-CM

## 2020-09-04 MED ORDER — LABETALOL HCL 100 MG PO TABS: ORAL_TABLET | ORAL | 0 refills | Status: DC

## 2020-09-04 NOTE — Addendum Note (Signed)
Addended by: Veronda Prude on: 09/04/2020 08:53 AM   Modules accepted: Orders

## 2020-09-04 NOTE — Telephone Encounter (Signed)
Patient calls nurse line regarding labetalol prescription. Patient picked up 30 tablets on 7/13 and is now out. Please advise if refill can be sent to [facility] on [address].   Veronda Prude, RN

## 2020-10-30 ENCOUNTER — Other Ambulatory Visit: Payer: Self-pay | Admitting: Family Medicine

## 2020-10-30 DIAGNOSIS — F32A Depression, unspecified: Secondary | ICD-10-CM

## 2020-10-30 DIAGNOSIS — J302 Other seasonal allergic rhinitis: Secondary | ICD-10-CM

## 2020-12-14 ENCOUNTER — Other Ambulatory Visit: Payer: Self-pay | Admitting: Family Medicine

## 2020-12-14 DIAGNOSIS — I10 Essential (primary) hypertension: Secondary | ICD-10-CM

## 2020-12-14 DIAGNOSIS — F32A Depression, unspecified: Secondary | ICD-10-CM

## 2020-12-18 ENCOUNTER — Other Ambulatory Visit: Payer: Self-pay | Admitting: Family Medicine

## 2020-12-18 DIAGNOSIS — B029 Zoster without complications: Secondary | ICD-10-CM

## 2021-01-02 ENCOUNTER — Ambulatory Visit: Payer: BC Managed Care – PPO | Admitting: Family Medicine

## 2021-01-10 ENCOUNTER — Other Ambulatory Visit: Payer: Self-pay

## 2021-01-10 ENCOUNTER — Ambulatory Visit: Payer: BC Managed Care – PPO | Admitting: Family Medicine

## 2021-01-11 ENCOUNTER — Ambulatory Visit (INDEPENDENT_AMBULATORY_CARE_PROVIDER_SITE_OTHER): Payer: BC Managed Care – PPO | Admitting: Family Medicine

## 2021-01-11 ENCOUNTER — Encounter: Payer: Self-pay | Admitting: Family Medicine

## 2021-01-11 VITALS — BP 126/84 | HR 93 | Ht 60.0 in | Wt 246.2 lb

## 2021-01-11 DIAGNOSIS — Z6841 Body Mass Index (BMI) 40.0 and over, adult: Secondary | ICD-10-CM | POA: Diagnosis not present

## 2021-01-11 DIAGNOSIS — Z3042 Encounter for surveillance of injectable contraceptive: Secondary | ICD-10-CM | POA: Diagnosis not present

## 2021-01-11 LAB — POCT URINE PREGNANCY: Preg Test, Ur: NEGATIVE

## 2021-01-11 MED ORDER — MEDROXYPROGESTERONE ACETATE 150 MG/ML IM SUSP
150.0000 mg | Freq: Once | INTRAMUSCULAR | Status: AC
  Administered 2021-01-11: 11:00:00 150 mg via INTRAMUSCULAR

## 2021-01-11 NOTE — Progress Notes (Signed)
° ° °  SUBJECTIVE:   CHIEF COMPLAINT / HPI:   Depo Shot   Contraception Management Amy Miller is a 45 y.o. female who presents to the clinic today for Depo injection. Last depo shot was in June. She is currently on "the tail end" of her period (started around 12/18). She declines STI testing today. States that she has been taking Depo for the last 10 to 11 years.  She has a few doses on and off.  She tried oral contraceptive pills in the past but does not want to go back on them. 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   Osteoarthritis Notes pain in both knees, right worse than left.  She is recommended for knee replacements.  Obesity States that she has an appointment for gastric sleeve in March 2023.  PERTINENT  PMH / PSH:  Past Medical History:  Diagnosis Date   Acute meniscal tear of knee RIGHT   GERD (gastroesophageal reflux disease)    Hypertension    Migraine    Seasonal allergies    Systolic murmur ASYMPTOMATIC--  ECHO IN EPIC--  EF 55-60%    OBJECTIVE:   BP 126/84    Pulse 93    Ht 5' (1.524 m)    Wt 246 lb 3.2 oz (111.7 kg)    SpO2 99%    BMI 48.08 kg/m    General: NAD, pleasant, able to participate in exam Cardiac: RRR, no murmurs. Respiratory: CTAB, normal effort, No wheezes, rales or rhonchi Psych: Normal affect and mood  ASSESSMENT/PLAN:   Depo-Provera contraceptive status Long-term use.  We discussed potential side effect for bone loss over time.  We discussed potential adverse effects of weight gain on Depo. Weight 232 lbs in October, 246 lbs today. We discussed other contraceptive options including Nexplanon.  She would like to read more about the Nexplanon and will schedule appointment if she decides to pursue it. -Upreg negative -Depo shot today; should be effective immediately given that she is currently on her period -Patient to schedule for Nexplanon insertion if she decides to  pursue   Osteoarthritis of knee No current date for surgery at this time but following with Ortho and plans for right, or possibly bilateral, knee replacement.  Taking Voltaren tablets for pain control. -Has a scheduled for appointment with Dr. Laroy Apple for blood work to assess renal function  BMI 45.0-49.9, adult (HCC) Weight has increased 14 lbs since two months ago. Has gastric sleeve procedure scheduled for March. We discussed other contraceptive options that may not affect weight as much- she will think about it.  -Continue to discuss lifestyle habits (exercise, diet) -Consider starting GLP-1 -Gastric sleeve scheduled for March      Sabino Dick, DO [facility]

## 2021-01-11 NOTE — Assessment & Plan Note (Signed)
No current date for surgery at this time but following with Ortho and plans for right, or possibly bilateral, knee replacement.  Taking Voltaren tablets for pain control. -Has a scheduled for appointment with Dr. Laroy Apple for blood work to assess renal function

## 2021-01-11 NOTE — Progress Notes (Signed)
Depo given in LUOQ today.  Site unremarkable & patient tolerated injection.    Next injection due 3/10-3/24.  Reminder card given.    Veronda Prude, RN

## 2021-01-11 NOTE — Assessment & Plan Note (Signed)
Weight has increased 14 lbs since two months ago. Has gastric sleeve procedure scheduled for March. We discussed other contraceptive options that may not affect weight as much- she will think about it.  -Continue to discuss lifestyle habits (exercise, diet) -Consider starting GLP-1 -Gastric sleeve scheduled for March

## 2021-01-11 NOTE — Assessment & Plan Note (Addendum)
Long-term use.  We discussed potential side effect for bone loss over time.  We discussed potential adverse effects of weight gain on Depo. Weight 232 lbs in October, 246 lbs today. We discussed other contraceptive options including Nexplanon.  She would like to read more about the Nexplanon and will schedule appointment if she decides to pursue it. -Upreg negative -Depo shot today; should be effective immediately given that she is currently on her period -Patient to schedule for Nexplanon insertion if she decides to pursue

## 2021-01-11 NOTE — Patient Instructions (Signed)
It was wonderful to see you today.  Please bring ALL of your medications with you to every visit.   Today we talked about:  -You received your Depo shot today. You will be due in 3 months.  -If you are interested in the Nexplanon (implantable device in the arm) please schedule an appointment. It will need to be a longer appointment since it is a procedure. These are typically very well tolerated and good for 3 years!   Thank you for choosing [facility].   Please call [phone] with any questions about today's appointment.  Please be sure to schedule follow up at the front  desk before you leave today.   Sabino Dick, DO PGY-2 Family Medicine

## 2021-01-17 NOTE — Progress Notes (Signed)
° ° °  SUBJECTIVE:   CHIEF COMPLAINT / HPI: Depression & Renal Function Labs  Depression/Anxiety Current medications of sertraline 25 mg and buspar 5 mg daily. Patient attests to taking these daily. PHQ-9 of 3 today. Was seen 11/17  and was cleared with psychological testing for gastric sleeve surgery in March. 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 She is anticipating her gastric sleeve surgery in March as well.   Osteoarthritis of Knee Takes diclofenac tablets as needed, usually 1 tablet at night when her knees are hurting her. Has been on it for around a year. Takes aleve as well. Orthopedic doctor told her to get kidney level checked. Denies any hematemesis, hematochezia, or abdominal pain.   PERTINENT  PMH / PSH: obesity, anxiety, depo use  OBJECTIVE:   BP 131/77    Pulse 94    Ht 5' (1.524 m)    Wt 246 lb 9.6 oz (111.9 kg)    SpO2 100%    BMI 48.16 kg/m   General: Well appearing, NAD, awake, alert, responsive to questions Head: Normocephalic atraumatic Respiratory: No increased work of breathing  Extremities: Moves upper and lower extremities freely Psych: Mood appropriate  ASSESSMENT/PLAN:   Anxiety Controlled on buspar 5 mg daily and sertraline 25 mg daily. PHQ-9 of 3 today.  -Refilled buspar and sertraline -Follow up as needed  Osteoarthritis of knee Has been taking diclofenac tablets (prescribed BID but takes nightly 1 tablet when needed) for a year.  -BMP to check kidney function     Levin Erp, MD [facility]

## 2021-01-18 ENCOUNTER — Ambulatory Visit (INDEPENDENT_AMBULATORY_CARE_PROVIDER_SITE_OTHER): Payer: BC Managed Care – PPO | Admitting: Student

## 2021-01-18 ENCOUNTER — Other Ambulatory Visit: Payer: Self-pay

## 2021-01-18 ENCOUNTER — Encounter: Payer: Self-pay | Admitting: Student

## 2021-01-18 VITALS — BP 131/77 | HR 94 | Ht 60.0 in | Wt 246.6 lb

## 2021-01-18 DIAGNOSIS — M179 Osteoarthritis of knee, unspecified: Secondary | ICD-10-CM | POA: Diagnosis not present

## 2021-01-18 DIAGNOSIS — F419 Anxiety disorder, unspecified: Secondary | ICD-10-CM | POA: Diagnosis not present

## 2021-01-18 DIAGNOSIS — F32A Depression, unspecified: Secondary | ICD-10-CM | POA: Diagnosis not present

## 2021-01-18 DIAGNOSIS — M25561 Pain in right knee: Secondary | ICD-10-CM | POA: Diagnosis not present

## 2021-01-18 DIAGNOSIS — M25562 Pain in left knee: Secondary | ICD-10-CM

## 2021-01-18 MED ORDER — SERTRALINE HCL 25 MG PO TABS: 25.0000 mg | ORAL_TABLET | Freq: Every day | ORAL | 3 refills | Status: DC

## 2021-01-18 MED ORDER — BUSPIRONE HCL 5 MG PO TABS: 5.0000 mg | ORAL_TABLET | Freq: Every day | ORAL | 3 refills | Status: DC

## 2021-01-18 NOTE — Patient Instructions (Signed)
It was great to see you! Thank you for allowing me to participate in your care!   I recommend that you always bring your medications to each appointment as this makes it easy to ensure we are on the correct medications and helps [place] not miss when refills are needed.  Our plans for today:  - We are checking your kidney levels today with your voltaren use - I refilled your sertraline and buspirone for you  We are checking some labs today, I will call you if they are abnormal will send you a MyChart message or a letter if they are normal.  If you do not hear about your labs in the next 2 weeks please let us know.  Take care and seek immediate care sooner if you develop any concerns. Please remember to show up 15 minutes before your scheduled appointment time!  Levin Erp, MD [facility]

## 2021-01-18 NOTE — Assessment & Plan Note (Signed)
Controlled on buspar 5 mg daily and sertraline 25 mg daily. PHQ-9 of 3 today.  -Refilled buspar and sertraline -Follow up as needed

## 2021-01-18 NOTE — Assessment & Plan Note (Signed)
Has been taking diclofenac tablets (prescribed BID but takes nightly 1 tablet when needed) for a year.  -BMP to check kidney function

## 2021-01-19 LAB — BASIC METABOLIC PANEL
BUN/Creatinine Ratio: 21 (ref 9–23)
BUN: 15 mg/dL (ref 6–24)
CO2: 17 mmol/L — ABNORMAL LOW (ref 20–29)
Calcium: 9.4 mg/dL (ref 8.7–10.2)
Chloride: 108 mmol/L — ABNORMAL HIGH (ref 96–106)
Creatinine, Ser: 0.7 mg/dL (ref 0.57–1.00)
Glucose: 105 mg/dL — ABNORMAL HIGH (ref 70–99)
Potassium: 4.1 mmol/L (ref 3.5–5.2)
Sodium: 141 mmol/L (ref 134–144)
eGFR: 107 mL/min/{1.73_m2} (ref >59)

## 2021-01-22 ENCOUNTER — Telehealth: Payer: Self-pay | Admitting: Student

## 2021-01-22 NOTE — Telephone Encounter (Signed)
Called patient and let her know her labs were normal and normal kidney function.

## 2021-03-21 ENCOUNTER — Other Ambulatory Visit: Payer: Self-pay | Admitting: Family Medicine

## 2021-03-21 DIAGNOSIS — I10 Essential (primary) hypertension: Secondary | ICD-10-CM

## 2021-04-23 ENCOUNTER — Ambulatory Visit (INDEPENDENT_AMBULATORY_CARE_PROVIDER_SITE_OTHER): Payer: BC Managed Care – PPO

## 2021-04-23 DIAGNOSIS — Z3042 Encounter for surveillance of injectable contraceptive: Secondary | ICD-10-CM

## 2021-04-23 LAB — POCT URINE PREGNANCY: Preg Test, Ur: NEGATIVE

## 2021-04-23 MED ORDER — MEDROXYPROGESTERONE ACETATE 150 MG/ML IM SUSP
150.0000 mg | Freq: Once | INTRAMUSCULAR | Status: AC
  Administered 2021-04-23: 150 mg via INTRAMUSCULAR

## 2021-04-23 NOTE — Progress Notes (Signed)
Patient presents in nurse clinic for depo provera and is not witin her dates.  ? ?Urine pregnancy test obtained and negative.  ? ?Depo administered in [facility]OQ without complication.  ? ?Next dates 07/09/2021-07/23/2021. ?

## 2021-04-25 ENCOUNTER — Other Ambulatory Visit: Payer: Self-pay | Admitting: Family Medicine

## 2021-04-25 DIAGNOSIS — I10 Essential (primary) hypertension: Secondary | ICD-10-CM

## 2021-04-30 NOTE — Telephone Encounter (Signed)
Patient calls nurse line in regards to Labetalol prescription.  ? ?Patient reports she is completely out and requested a refill last week.  ? ?Prescription was denied yesterday by PCP, "prescriber not at this practice."  ? ?Patient reports PCP refill it last on 3/3. ? ?Will forward back to PCP for advisement.  ?

## 2021-05-01 ENCOUNTER — Telehealth: Payer: Self-pay | Admitting: Family Medicine

## 2021-05-01 DIAGNOSIS — I10 Essential (primary) hypertension: Secondary | ICD-10-CM

## 2021-05-01 MED ORDER — LABETALOL HCL 100 MG PO TABS: 100.0000 mg | ORAL_TABLET | Freq: Every day | ORAL | 0 refills | Status: DC

## 2021-05-01 NOTE — Telephone Encounter (Signed)
Called pt to discuss prescription medication she has been prescribed previously during gHTN.  She states she has been taking this medication for several years.  BB are not first line for HTN.  Currently has a headache as she is out of her medication.  ? ?Pt advised to schedule an appointment. She was unable to look at her calendar to schedule while we were on the phone. Told patient she will receive a 14 day supply of her medication. States she will call to schedule an appointment prior to her next refill. ? ?Katha Cabal, DO ?PGY-3, [facility] ?05/01/2021  ? ? ? ? ?

## 2021-05-01 NOTE — Telephone Encounter (Signed)
Patient calls nurse line checking status of Labetalol.  ? ?PCP agreed to call patient to discuss.  ? ? ?

## 2021-05-20 ENCOUNTER — Other Ambulatory Visit: Payer: Self-pay

## 2021-05-20 DIAGNOSIS — I10 Essential (primary) hypertension: Secondary | ICD-10-CM

## 2021-05-20 MED ORDER — LABETALOL HCL 100 MG PO TABS: 100.0000 mg | ORAL_TABLET | Freq: Every day | ORAL | 0 refills | Status: DC

## 2021-05-29 ENCOUNTER — Telehealth: Payer: Self-pay | Admitting: Family Medicine

## 2021-05-29 NOTE — Telephone Encounter (Signed)
Patient requested PCP switch. I met with her PCP to discuss and we agreed on switching. I called the patient and she wishes to be switched to Dr. Jinny Sanders. ?I advised her of the clinic policy regarding PCP switch. As we allow only one time PCP switch, she would not be able to request a switch in the future. SHe verbalized understanding and agreed with the plan. ? ?Dr. Jinny Sanders, also agreed with the switch. She'll schedule PCP f/u soon. ?

## 2021-05-30 NOTE — Telephone Encounter (Signed)
Completed.  Amy Miller, CMA ? ?

## 2021-06-06 ENCOUNTER — Ambulatory Visit: Payer: BC Managed Care – PPO | Admitting: Student

## 2021-06-06 ENCOUNTER — Encounter: Payer: Self-pay | Admitting: Student

## 2021-06-06 VITALS — BP 130/77 | HR 84 | Ht 60.0 in | Wt 213.8 lb

## 2021-06-06 DIAGNOSIS — F419 Anxiety disorder, unspecified: Secondary | ICD-10-CM | POA: Diagnosis not present

## 2021-06-06 DIAGNOSIS — Z3042 Encounter for surveillance of injectable contraceptive: Secondary | ICD-10-CM

## 2021-06-06 DIAGNOSIS — I10 Essential (primary) hypertension: Secondary | ICD-10-CM

## 2021-06-06 MED ORDER — LABETALOL HCL 100 MG PO TABS: 100.0000 mg | ORAL_TABLET | Freq: Every day | ORAL | 1 refills | Status: DC

## 2021-06-06 NOTE — Progress Notes (Signed)
    SUBJECTIVE:   CHIEF COMPLAINT / HPI: refill  Hx of Hypertension BP today is 130/77.  Denies any headache, vision changes, shortness of breath or chest pain. Does not take her BP regularly at home. Was started on labetalol 100 mg daily 5 years ago during pregnancy for gestational hypertension and was continued on this. She says when she stops her labetalol she feels dizzy and "off." Is open to trying other antihypertensives but would like to stay on her labetalol  Depo-provera Long term use, discussed she gets spotting intermittently.  PERTINENT  PMH / PSH: gerd, migraines  OBJECTIVE:   BP 130/77   Pulse 84   Ht 5' (1.524 m)   Wt 213 lb 12.8 oz (97 kg)   SpO2 100%   BMI 41.75 kg/m   General: Well appearing, NAD, awake, alert, responsive to questions Head: Normocephalic atraumatic CV: Regular rate and rhythm no murmurs rubs or gallops Respiratory: Clear to ausculation bilaterally, no wheezes rales or crackles, chest rises symmetrically,  no increased work of breathing Abdomen: Soft, non-tender, non-distended, normoactive bowel sounds  Extremities: Moves upper and lower extremities freely, no edema in LE Neuro: No focal deficits Skin: No rashes or lesions visualized   ASSESSMENT/PLAN:   Hypertension No longer hypertensive (130/77 this morning). Discussed labetalol is not an effective antihypertensive. Patient is open to trying other antihypertensives but I don't think she requires any currently. HR was appropriate today at 11 (she took it this morning) I discussed decreasing labetalol to 50 instead but patient was not wanting to do this today. I discussed labetalol will not be prescribed under HTN diagnosis and more related to anxiety. Will refill 100 mg labetalol daily for anxiety in hopes to wean this in future given patient has appropriate BP and HR. Discussed close monitoring of this at home. Plan discussed with Dr. Deirdre Priest in office -Monitor  Depo-Provera contraceptive  status Discussed long term use effects. Is having some spotting as well. Will plan to follow up with patient in 1 month to discuss Depo shot/bleeding and other contraceptive measures. (Is not interested in IUD or pills)   Anxiety - labetalol (NORMODYNE) 100 MG tablet; Take 1 tablet (100 mg total) by mouth daily.  Dispense: 90 tablet; Refill: 1   Levin Erp, MD [facility]

## 2021-06-06 NOTE — Patient Instructions (Signed)
It was great to see you! Thank you for allowing me to participate in your care!    Our plans for today:  - I have refilled your labetalol 100 mg daily, please watch your BP while on this - We will follow up in about a month to talk about bleeding/Depo shot  Take care and seek immediate care sooner if you develop any concerns. Please remember to show up 15 minutes before your scheduled appointment time!  Gerrit Heck, MD [place]

## 2021-06-08 NOTE — Assessment & Plan Note (Signed)
Discussed long term use effects. Is having some spotting as well. Will plan to follow up with patient in 1 month to discuss Depo shot/bleeding and other contraceptive measures. (Is not interested in IUD or pills)

## 2021-06-08 NOTE — Assessment & Plan Note (Addendum)
No longer hypertensive (130/77 this morning). Discussed labetalol is not an effective antihypertensive. Patient is open to trying other antihypertensives but I don't think she requires any currently. HR was appropriate today at 62 (she took it this morning) I discussed decreasing labetalol to 50 instead but patient was not wanting to do this today. I discussed labetalol will not be prescribed under HTN diagnosis and more related to anxiety. Will refill 100 mg labetalol daily for anxiety in hopes to wean this in future given patient has appropriate BP and HR. Discussed close monitoring of this at home. Plan discussed with Dr. Erin Hearing in office -Monitor

## 2021-11-01 ENCOUNTER — Ambulatory Visit (INDEPENDENT_AMBULATORY_CARE_PROVIDER_SITE_OTHER): Payer: BC Managed Care – PPO

## 2021-11-01 DIAGNOSIS — Z3042 Encounter for surveillance of injectable contraceptive: Secondary | ICD-10-CM

## 2021-11-01 LAB — POCT URINE PREGNANCY: Preg Test, Ur: NEGATIVE

## 2021-11-01 MED ORDER — MEDROXYPROGESTERONE ACETATE 150 MG/ML IM SUSP
150.0000 mg | Freq: Once | INTRAMUSCULAR | Status: AC
  Administered 2021-11-01: 150 mg via INTRAMUSCULAR

## 2021-11-01 NOTE — Progress Notes (Signed)
Patient presents in nurse clinic for depo provera and is not witin her dates.    Urine pregnancy test obtained and negative.    Depo administered in [place] without complication.    Next depo due pm 01/17/2022-01/12/204.  Patient instructed she MUST make an apt with her PCP before her next injection. I advised her to schedule PCP apt within her dates she does not have to come back twice. Patient agreed with plan.

## 2021-11-06 ENCOUNTER — Other Ambulatory Visit: Payer: Self-pay | Admitting: Family Medicine

## 2021-11-06 ENCOUNTER — Other Ambulatory Visit: Payer: Self-pay | Admitting: Student

## 2021-11-06 DIAGNOSIS — F32A Depression, unspecified: Secondary | ICD-10-CM

## 2021-11-19 ENCOUNTER — Telehealth: Payer: Self-pay

## 2021-11-19 ENCOUNTER — Other Ambulatory Visit: Payer: Self-pay | Admitting: Student

## 2021-11-19 DIAGNOSIS — R928 Other abnormal and inconclusive findings on diagnostic imaging of breast: Secondary | ICD-10-CM

## 2021-11-19 NOTE — Telephone Encounter (Signed)
Patient calls nurse line regarding abnormal mammogram. She had mammogram performed at [facility] on 11/11/21. Results accessible through Care everywhere.   Per report, recommendation is that patient receive spot compression mammogram of left breast and left breast ultrasound. She is requesting that these orders be placed to the Breast center in [place].   Patient is requesting returned phone call once orders are placed so that she can schedule at the Breast Center.   Forwarding request to PCP.   Talbot Grumbling, RN

## 2021-11-21 ENCOUNTER — Other Ambulatory Visit: Payer: Self-pay | Admitting: Student

## 2021-11-21 ENCOUNTER — Other Ambulatory Visit: Payer: Self-pay

## 2021-11-21 NOTE — Telephone Encounter (Signed)
Attempted to reach patient. No answer. LVM that orders have been placed for Mammogram, and that she can  call at her earliest conveniens to make her appt with the breast center. Salvatore Marvel, CMA'

## 2021-12-02 ENCOUNTER — Other Ambulatory Visit: Payer: Self-pay | Admitting: Student

## 2021-12-02 DIAGNOSIS — F419 Anxiety disorder, unspecified: Secondary | ICD-10-CM

## 2022-02-06 ENCOUNTER — Other Ambulatory Visit: Payer: Self-pay

## 2022-02-06 DIAGNOSIS — B029 Zoster without complications: Secondary | ICD-10-CM

## 2022-02-07 MED ORDER — ACYCLOVIR 400 MG PO TABS: 400.0000 mg | ORAL_TABLET | Freq: Three times a day (TID) | ORAL | 0 refills | Status: DC

## 2022-02-13 ENCOUNTER — Other Ambulatory Visit: Payer: Self-pay | Admitting: Student

## 2022-02-13 DIAGNOSIS — F32A Depression, unspecified: Secondary | ICD-10-CM

## 2022-03-21 ENCOUNTER — Ambulatory Visit (INDEPENDENT_AMBULATORY_CARE_PROVIDER_SITE_OTHER): Payer: BC Managed Care – PPO

## 2022-03-21 DIAGNOSIS — Z3042 Encounter for surveillance of injectable contraceptive: Secondary | ICD-10-CM | POA: Diagnosis not present

## 2022-03-21 LAB — POCT URINE PREGNANCY: Preg Test, Ur: NEGATIVE

## 2022-03-21 MED ORDER — MEDROXYPROGESTERONE ACETATE 150 MG/ML IM SUSY
150.0000 mg | PREFILLED_SYRINGE | Freq: Once | INTRAMUSCULAR | Status: AC
  Administered 2022-03-21: 150 mg via INTRAMUSCULAR

## 2022-03-21 NOTE — Progress Notes (Addendum)
Patient presents in nurse clinic for depo provera and is not witin her dates.    Urine pregnancy test obtained and negative.   Last contraceptive apt was 06/06/2021.   Depo administered in RUOQ without complication.    Next depo due pm 06/06/2022-06/20/2022.   Patient instructed she MUST make an apt with her PCP before her next injection. I advised her to schedule PCP apt within her dates she does not have to come back twice. Patient agreed with plan.   I was available as preceptor in clinic. I agree with the assessment and plan as documented below.  Yehuda Savannah, MD

## 2022-04-07 ENCOUNTER — Other Ambulatory Visit: Payer: Self-pay | Admitting: Student

## 2022-04-07 DIAGNOSIS — F419 Anxiety disorder, unspecified: Secondary | ICD-10-CM

## 2022-04-07 NOTE — Telephone Encounter (Signed)
Needs appt

## 2022-04-09 ENCOUNTER — Other Ambulatory Visit: Payer: Self-pay | Admitting: Student

## 2022-04-09 DIAGNOSIS — F419 Anxiety disorder, unspecified: Secondary | ICD-10-CM

## 2022-04-11 ENCOUNTER — Other Ambulatory Visit: Payer: Self-pay | Admitting: Student

## 2022-04-11 DIAGNOSIS — F419 Anxiety disorder, unspecified: Secondary | ICD-10-CM

## 2022-04-11 NOTE — Telephone Encounter (Signed)
Patient returns call to nurse line regarding prescription refill. Patient states that she has been out of medication for about one week.   Please advise.   Talbot Grumbling, RN

## 2022-04-12 NOTE — Telephone Encounter (Signed)
Needs appt

## 2022-04-26 ENCOUNTER — Other Ambulatory Visit: Payer: Self-pay | Admitting: Student

## 2022-04-26 DIAGNOSIS — B029 Zoster without complications: Secondary | ICD-10-CM

## 2022-05-02 NOTE — Progress Notes (Signed)
    SUBJECTIVE:   CHIEF COMPLAINT / HPI:   ***  PERTINENT  PMH / PSH: ***  OBJECTIVE:   There were no vitals taken for this visit.  ***  ASSESSMENT/PLAN:   No problem-specific Assessment & Plan notes found for this encounter.     Ralpheal Zappone, MD [facility]  

## 2022-05-05 ENCOUNTER — Ambulatory Visit: Payer: BC Managed Care – PPO | Admitting: Student

## 2022-05-05 ENCOUNTER — Encounter: Payer: Self-pay | Admitting: Student

## 2022-05-05 VITALS — BP 130/83 | HR 90 | Temp 99.3°F | Ht 60.0 in | Wt 204.4 lb

## 2022-05-05 DIAGNOSIS — A6000 Herpesviral infection of urogenital system, unspecified: Secondary | ICD-10-CM

## 2022-05-05 DIAGNOSIS — F419 Anxiety disorder, unspecified: Secondary | ICD-10-CM | POA: Diagnosis not present

## 2022-05-05 MED ORDER — PROPRANOLOL HCL 10 MG PO TABS
10.0000 mg | ORAL_TABLET | Freq: Every day | ORAL | 0 refills | Status: DC | PRN
Start: 2022-05-05 — End: 2022-06-19

## 2022-05-05 MED ORDER — VALACYCLOVIR HCL 500 MG PO TABS: 500.0000 mg | ORAL_TABLET | Freq: Every day | ORAL | 1 refills | Status: DC

## 2022-05-05 MED ORDER — SERTRALINE HCL 50 MG PO TABS
50.0000 mg | ORAL_TABLET | Freq: Every day | ORAL | 0 refills | Status: DC
Start: 2022-05-05 — End: 2022-06-19

## 2022-05-05 NOTE — Assessment & Plan Note (Signed)
Has been having more recurring outbreaks.  Will plan to do suppressive treatment for patient. -500 mg valacyclovir daily-we have room to increase this if not controlling outbreaks

## 2022-05-05 NOTE — Assessment & Plan Note (Signed)
We discussed that labetalol is not the most effective medication in terms of anxiety.  We discussed we could trial a low-dose beta-blocker to use as needed if she knows that she is can have a stressful situation which she was agreeable to.  We will increase her Zoloft to 50 mg daily as well and continue her BuSpar as needed. -Increase Zoloft to 50 mg daily -Continue BuSpar -Discontinue labetalol 100 mg daily -Start propranolol 10 mg as needed for stressful situations-May take 1 hour prior to this

## 2022-05-05 NOTE — Patient Instructions (Addendum)
It was great to see you! Thank you for allowing me to participate in your care!   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  Levin Erp, MD

## 2022-06-11 ENCOUNTER — Ambulatory Visit: Payer: BC Managed Care – PPO | Admitting: Student

## 2022-06-11 NOTE — Progress Notes (Deleted)
    SUBJECTIVE:   CHIEF COMPLAINT / ZOX:WRUEAVW f/u   Anxiety Switched from labetalol to propanolol 10 mg PRN for stressful situations and increased zoloft to 50 mg daily. Continuing buspar.   PERTINENT  PMH / PSH: ***  OBJECTIVE:   There were no vitals taken for this visit.  ***  ASSESSMENT/PLAN:   No problem-specific Assessment & Plan notes found for this encounter.     Levin Erp, MD [facility]

## 2022-06-19 ENCOUNTER — Ambulatory Visit: Payer: BC Managed Care – PPO | Admitting: Student

## 2022-06-19 DIAGNOSIS — A6000 Herpesviral infection of urogenital system, unspecified: Secondary | ICD-10-CM

## 2022-06-19 DIAGNOSIS — F32A Depression, unspecified: Secondary | ICD-10-CM

## 2022-06-19 DIAGNOSIS — F419 Anxiety disorder, unspecified: Secondary | ICD-10-CM

## 2022-06-19 MED ORDER — SERTRALINE HCL 50 MG PO TABS
50.0000 mg | ORAL_TABLET | Freq: Every day | ORAL | 0 refills | Status: DC
Start: 2022-06-19 — End: 2022-10-16

## 2022-06-19 MED ORDER — PROPRANOLOL HCL 10 MG PO TABS: 10.0000 mg | ORAL_TABLET | Freq: Every day | ORAL | 0 refills | Status: DC | PRN

## 2022-06-19 MED ORDER — BUSPIRONE HCL 5 MG PO TABS: 5.0000 mg | ORAL_TABLET | Freq: Two times a day (BID) | ORAL | 0 refills | Status: DC

## 2022-06-19 MED ORDER — VALACYCLOVIR HCL 500 MG PO TABS: 500.0000 mg | ORAL_TABLET | Freq: Every day | ORAL | 1 refills | Status: DC

## 2022-06-19 NOTE — Patient Instructions (Signed)
It was great to see you! Thank you for allowing me to participate in your care!   Our plans for today:  - Follow up with me in June to see how your symptoms are doing  Take care and seek immediate care sooner if you develop any concerns.  Levin Erp, MD

## 2022-06-19 NOTE — Progress Notes (Signed)
    SUBJECTIVE:   CHIEF COMPLAINT / HPI: Follow up  Anxiety At last visit labetalol was switch to as needed propanolol 10 mg. Also increased zoloft to 50 mg daily. Continued buspar. Doing okay but has been having occasional headaches every other day. Resolves with OTC analgesics. Has been using propanolol 1-2 times a week before stressful situations. Works very well. Has overall doing well on current dosage of zoloft and propanolol. Does not take BP at home (hx of gHTN which was main reason for labetalol initially).   PERTINENT  PMH / PSH:   OBJECTIVE:   BP 114/68   Pulse 87   Wt 208 lb 6.4 oz (94.5 kg)   SpO2 98%   BMI 40.70 kg/m   General: Well appearing, NAD, awake, alert, responsive to questions Head: Normocephalic atraumatic Respiratory: chest rises symmetrically,  no increased work of breathing Extremities: Moves upper and lower extremities freely Neuro: No focal deficits Skin: No rashes or lesions visualized   ASSESSMENT/PLAN:   Anxiety Seems to be doing okay on current regimen. 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 Believes headaches coincide with stress. Gets these in temple region. No auras/issues that make me think migrainous right now.  -Continue current regimen -Return after June to see if improved after stressor more relieved    Levin Erp, MD [facility]

## 2022-06-19 NOTE — Assessment & Plan Note (Signed)
Seems to be doing okay on current regimen. 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 Believes headaches coincide with stress. Gets these in temple region. No auras/issues that make me think migrainous right now.  -Continue current regimen -Return after June to see if improved after stressor more relieved

## 2022-07-22 ENCOUNTER — Other Ambulatory Visit: Payer: Self-pay | Admitting: Student

## 2022-07-22 DIAGNOSIS — F419 Anxiety disorder, unspecified: Secondary | ICD-10-CM

## 2022-09-02 ENCOUNTER — Other Ambulatory Visit: Payer: Self-pay | Admitting: Student

## 2022-09-02 DIAGNOSIS — F419 Anxiety disorder, unspecified: Secondary | ICD-10-CM

## 2022-09-12 ENCOUNTER — Ambulatory Visit: Payer: BC Managed Care – PPO | Admitting: Student

## 2022-09-12 ENCOUNTER — Encounter: Payer: Self-pay | Admitting: Student

## 2022-09-12 VITALS — BP 102/62 | HR 78 | Ht 60.0 in | Wt 214.0 lb

## 2022-09-12 DIAGNOSIS — F419 Anxiety disorder, unspecified: Secondary | ICD-10-CM

## 2022-09-12 DIAGNOSIS — Z3042 Encounter for surveillance of injectable contraceptive: Secondary | ICD-10-CM | POA: Diagnosis not present

## 2022-09-12 LAB — POCT URINE PREGNANCY: Preg Test, Ur: NEGATIVE

## 2022-09-12 MED ORDER — MEDROXYPROGESTERONE ACETATE 150 MG/ML IM SUSY
150.0000 mg | PREFILLED_SYRINGE | Freq: Once | INTRAMUSCULAR | Status: AC
Start: 2022-09-12 — End: 2022-09-12
  Administered 2022-09-12: 150 mg via INTRAMUSCULAR

## 2022-09-12 NOTE — Progress Notes (Signed)
Pt is here today for a depo.  She was due by 05/2022.    Last contraceptive appt was: today  Is not currently having intercourse.  Her upreg today is negative.   No need for repeat upreg in 2 weeks  Pt given injection today in her LUOQ, tolerated well.  She is due for next depo 11/28/22 - 12/12/2022. Reminder card given.   Jone Baseman, CMA

## 2022-09-12 NOTE — Patient Instructions (Addendum)
It was great to see you today! Thank you for choosing [facility] for your primary care.  Schedule your colonoscopy to help detect colon cancer. The stomach doctors will call to schedule an appt with you. If you decide against this, please ask about the cologuard as an option.   and We recommend hepatitis C testing once in your lifetime as well as HIV. If you have not had these done, please inform us if you would like them done     Today we addressed: Continue with your medications for your anxiety We will give you your depo   If you haven't already, sign up for My Chart to have easy access to your labs results, and communication with your primary care physician. I recommend that you always bring your medications to each appointment as this makes it easy to ensure you are on the correct medications and helps [place] not miss refills when you need them. Call the clinic at [phone] if your symptoms worsen or you have any concerns. Return in about 3 months (around 12/13/2022) for Depo. Please arrive 15 minutes before your appointment to ensure smooth check in process.  We appreciate your efforts in making this happen.  Thank you for allowing me to participate in your care, Alfredo Martinez, MD 09/12/2022, 3:29 PM PGY-3, [facility]

## 2022-09-12 NOTE — Addendum Note (Signed)
Addended by: Jone Baseman D on: 09/12/2022 04:40 PM   Modules accepted: Orders

## 2022-09-12 NOTE — Assessment & Plan Note (Signed)
Presenting for depo to be given after Upreg

## 2022-09-12 NOTE — Assessment & Plan Note (Signed)
Continue pharmacologic therapy at current dose  Patient without SI/HI; denies active plan and reports that they are safe to continue with outpatient treatment.  Close follow up with PCP

## 2022-09-12 NOTE — Progress Notes (Signed)
  SUBJECTIVE:   CHIEF COMPLAINT / HPI:   Anxiety F/U:  At last visit, the patient was instructed to continue with as needed propranolol 10 mg and Zoloft 50 mg daily as well as BuSpar.  She reports improvement with the propranolol. Started new job and overall doing well.   Presenting for depo as well, has had no problems with the depo injection. No side effects.   HCM -Needs Hep C, colonoscopy  PERTINENT  PMH / PSH:   Past Medical History:  Diagnosis Date   Acute meniscal tear of knee RIGHT   GERD (gastroesophageal reflux disease)    Hypertension    Migraine    Seasonal allergies    Systolic murmur ASYMPTOMATIC--  ECHO IN EPIC--  EF 55-60%    Patient Care Team: Alfredo Martinez, MD as PCP - General (Family Medicine) OBJECTIVE:  BP 102/62   Pulse 78   Ht 5' (1.524 m)   Wt 214 lb (97.1 kg)   SpO2 98%   BMI 41.79 kg/m  Physical Exam  General: NAD, pleasant, able to participate in exam Card: RRR Respiratory: No respiratory distress Skin: warm and dry, no rashes noted Psych: Normal affect and mood  ASSESSMENT/PLAN:  Encounter for surveillance of injectable contraceptive -     POCT urine pregnancy  Depo-Provera contraceptive status Assessment & Plan: Presenting for depo to be given after Upreg   Anxiety Assessment & Plan: Continue pharmacologic therapy at current dose  Patient without SI/HI; denies active plan and reports that they are safe to continue with outpatient treatment.  Close follow up with PCP    AVS information for need of colonoscopy & HepC  Return in about 3 months (around 12/13/2022) for Depo. Alfredo Martinez, MD 09/12/2022, 4:16 PM PGY-3, [facility]

## 2022-10-15 ENCOUNTER — Other Ambulatory Visit: Payer: Self-pay | Admitting: Student

## 2022-10-15 DIAGNOSIS — F419 Anxiety disorder, unspecified: Secondary | ICD-10-CM

## 2022-10-15 DIAGNOSIS — F32A Depression, unspecified: Secondary | ICD-10-CM

## 2022-11-23 ENCOUNTER — Other Ambulatory Visit: Payer: Self-pay | Admitting: Student

## 2022-11-23 DIAGNOSIS — F419 Anxiety disorder, unspecified: Secondary | ICD-10-CM

## 2022-11-26 NOTE — Telephone Encounter (Signed)
 Received fax from pharmacy requesting the below refill:  Medication: Esomeprazole Dr 40 mg Qty: 90  Pharmacy: [facility]

## 2022-12-01 ENCOUNTER — Ambulatory Visit (INDEPENDENT_AMBULATORY_CARE_PROVIDER_SITE_OTHER): Payer: BC Managed Care – PPO

## 2022-12-01 DIAGNOSIS — Z23 Encounter for immunization: Secondary | ICD-10-CM | POA: Diagnosis not present

## 2022-12-01 NOTE — Progress Notes (Signed)
Patient presents to nurse clinic for Flu vaccine.  Vaccine administered in LD without complication.  See admin for details.

## 2023-02-27 ENCOUNTER — Telehealth: Payer: Self-pay

## 2023-02-27 ENCOUNTER — Ambulatory Visit (INDEPENDENT_AMBULATORY_CARE_PROVIDER_SITE_OTHER): Payer: 59 | Admitting: Student

## 2023-02-27 VITALS — BP 130/72 | HR 100 | Ht 60.0 in | Wt 223.8 lb

## 2023-02-27 DIAGNOSIS — Z3042 Encounter for surveillance of injectable contraceptive: Secondary | ICD-10-CM

## 2023-02-27 LAB — POCT URINE PREGNANCY: Preg Test, Ur: NEGATIVE

## 2023-02-27 MED ORDER — MEDROXYPROGESTERONE ACETATE 150 MG/ML IM SUSY
150.0000 mg | PREFILLED_SYRINGE | Freq: Once | INTRAMUSCULAR | Status: AC
Start: 2023-02-27 — End: 2023-02-27
  Administered 2023-02-27: 150 mg via INTRAMUSCULAR

## 2023-02-27 NOTE — Telephone Encounter (Signed)
 Called patient and corrected the dates for the next depo shot because I think I wrote down the wrong dates.  Should be April 25-May 9   Patient understanding Nd appreciative for call.  Gerold Kos, CMA

## 2023-02-27 NOTE — Patient Instructions (Signed)
 It was great to see you today! Thank you for choosing [facility] for your primary care.  Today we addressed: Depo provided today.  Strongly recommend you consider a different form of birth control within the next year.  Would be unwise to continue this medication further.  There is considerable bone health risks with doing so.  If you haven't already, sign up for My Chart to have easy access to your labs results, and communication with your primary care physician.  Please arrive 15 minutes before your appointment to ensure smooth check in process.  We appreciate your efforts in making this happen.  Thank you for allowing me to participate in your care, Kieth Johnson, DO 02/27/2023, 5:02 PM PGY-3, [facility]

## 2023-02-27 NOTE — Progress Notes (Signed)
  SUBJECTIVE:   CHIEF COMPLAINT / HPI:   Patient would like to restart Depo, last received in August.  She has been on/off Depo for the last 13 years.  She is not interested in changing form of birth control at this time.  She has tried IUD however there is difficulty with placement.  She is preferring to not start OCPs or Nexplanon.  PERTINENT  PMH / PSH: HTN(?), migraines  OBJECTIVE:  BP 130/72   Pulse 100   Ht 5' (1.524 m)   Wt 223 lb 12.8 oz (101.5 kg)   SpO2 96%   BMI 43.71 kg/m  General: Well-appearing, NAD  ASSESSMENT/PLAN:   Assessment & Plan Encounter for surveillance of injectable contraceptive Pregnancy test negative, provided Depo.  Counseled thoroughly on osteoporosis risk for continuation of Depo.  Strongly recommend changing to different form of birth control within the year.  Patient understood.  Kieth Johnson, DO 02/27/2023, 5:06 PM PGY-3, [facility]

## 2023-02-27 NOTE — Progress Notes (Signed)
 Patient presents for Depo shot and is outside of her window.  Patient given urine pregnancy test and is negative.  Patient given Depo shot in LUOQ and tolerated it well.  Patient give reminder card of 05/15/2023- 05/29/2023  .Gerold Kos, CMA

## 2023-03-12 NOTE — Progress Notes (Signed)
 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 Yes   Consent:  Patient's identity was confirmed. Presenting condition or illness was discussed with the patient/personal representative. Current proposed treatment for presenting condition or illness was explained to patient/personal representative along with the likely benefits and any significant risks or complications associated with the provision of treatment by audio/video means. The patient/personal representative verbally authorized treatment to be provided by audio/video, which may include a limited review of patient's current health status, medication, or other treatment recommendations, patient education, and an opportunity to ask questions about condition and treatment. Verbal Consent Granted by Patient/Personal Representative:Yes   Visit Information: Modality: 2-Way Real-Time Audio/Video  Video Start Time: 1:13 Video Stop Time: 1:28 Video Total Time: 15 minutes    CC: Amy Miller returns to follow up on treatment of excess body weight and associated risk factors/co-morbidities  Date of Surgery: 03/26/2021 Type of Surgery: Sleeve Initial Weight With Surgeon: 240  Last Weight: 204 Lowest Weight: 204 Today's Weight:  220 (self report)  Weight Loss Since Last Visit: +16  Total Weight Loss to Date: 20  Interim history:  History of Present Illness The patient presents via virtual visit for evaluation of weight management.  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 Despite her efforts to maintain a balanced diet and regular  exercise regimen, she has noticed a weight gain of approximately 20 pounds, bringing her current weight to 220 pounds from a previous low of 204 pounds. She is currently on a regimen of phentermine 15 mg, which she reports as beneficial. However, she acknowledges the need for conscious eating due to the appetite-suppressing effects of the medication. Her diet includes protein shakes as snacks, cold brew coffee without sugar, bacon and cheese omelets for breakfast, and tuna or chicken salads for lunch. Dinner typically consists of a pouch of tuna, and she avoids unhealthy snacks when experiencing difficulty falling asleep, opting instead for peanut butter or shakes. She has not yet tried injectable medications. She has not seen the dietitian in almost a year. She has previously tried Topamax but discontinued its use due to adverse reactions, including tingling sensations.  She has been experiencing chronic headaches, which she believes are exacerbated by stress. Additionally, she reports poor sleep quality and is seeking recommendations for potential sleep aids. She has been using over-the-counter Tylenol  PM but is concerned about its long-term effects on her organs. She has also tried magnesium spray for her feet.    Problems being monitored/treated and associated ROS:  The following portions of the patient's history were reviewed and updated as appropriate: current medications and problem list.       Wt Readings from Last 4 Encounters:  01/28/23 99.3 kg (218 lb 14.7 oz)  04/16/22 92.8 kg (204 lb 9.6 oz)  04/16/22 92.8 kg (204 lb 9.6 oz)  10/21/21 92.7 kg (204 lb 5.9 oz)     Exam General: NAD Psych: Alert, oriented; normal behavior, judgement  Plan  1. History of sleeve gastrectomy      2. Dietary counseling and surveillance      3. BMI 40.0-44.9, adult (CMD)      4. Medication management  New Medications Ordered This Visit  Medications  . phentermine 30 mg cap    Sig:  Take 1 capsule (30 mg total) by mouth daily.    Dispense:  30 capsule    Refill:  1    Medications Discontinued During This Encounter  Medication Reason  . phentermine 15 mg cap      Assessment & Plan 1. Weight management. Her current weight is 220 pounds by self report, with a goal to reduce by 6 to 8 pounds before the next visit. She has not consulted a dietitian in nearly a year. The dosage of phentermine will be increased to 30 mg, to be taken in the morning. A prescription for phentermine 30 mg will be sent to [facility] in [place]. She is advised to maintain a food journal for at least 5 days prior to her consultation with the dietitian. She is also encouraged to continue her self-care practices, including massages, to manage tension and stress. If the increased dosage of phentermine does not yield the desired results, alternative treatment options will be considered.  2. Insomnia. She reports ongoing issues with sleep and has been using over-the-counter medications like Tylenol  PM. She is advised to consult her primary care physician for appropriate sleep medication. Potential prescription sleep aids such as Ambien , Lunesta, Sonata, Belsomra, and trazodone were discussed. She is also encouraged to practice good sleep hygiene, including avoiding caffeine late in the day and turning off screens 30 minutes before bedtime.  Follow-up The patient will follow up in 6 to 8 weeks.     I have personally spent 27 minutes involved in face-to-face and non-face-to-face activities for this patient on the day of the visit.  Professional time spent includes the following activities, in addition to those noted in the documentation: preparing to see the patient by review of history and previous tests; counseling and educating the patient; ordering medications, tests, or procedures; referring andcommunicating with other health care professionals; and documenting clinical information in the health record.

## 2023-05-28 NOTE — Telephone Encounter (Signed)
-----   Message from Zorita Petrin, [place] sent at 05/28/2023  2:45 PM EDT ----- Pt needs follow up in 3 months

## 2023-05-28 NOTE — Progress Notes (Signed)
 Medical Visit Program: Surgery  Stage: s/p sleeve 2023 Plan: 3 month follow up, med management    05/28/2023  Location Information: Patient State (at time of visit): [place]  Patient Location (at time of visit):Home/Other Non-Medical  Provider Location: Home Is provider licensed to provide clinical care in the current location/state of the patient? Yes   Consent:  Patient's identity was confirmed. Presenting condition or illness was discussed with the patient/personal representative. Current proposed treatment for presenting condition or illness was explained to patient/personal representative along with the likely benefits and any significant risks or complications associated with the provision of treatment by audio/video means. The patient/personal representative verbally authorized treatment to be provided by audio/video, which may include a limited review of patient's current health status, medication, or other treatment recommendations, patient education, and an opportunity to ask questions about condition and treatment. Verbal Consent Granted by Patient/Personal Representative:Yes   Visit Information: Modality: 2-Way Real-Time Audio/Video  Video Start Time: 2:30 Video Stop Time: 2:38 Video Total Time: 8 minutes    CC: Amy Miller returns to follow up on treatment of excess body weight and associated risk factors/co-morbidities  Date of Surgery: 03/26/2021 Type of Surgery: Sleeve Initial Weight With Surgeon: 240  Last Weight: 220 (self report) Lowest Weight: 204 Today's Weight:  unknown, no recent weight Weight Loss Since Last Visit:  unknown  Total Weight Loss to Date: unknown   Interim history: History of Present Illness The patient presents via virtual visit for weight management.  She reports a positive response to the recent increase in her Phentermine dosage, noting a decrease in cravings. She has not monitored her weight recently but perceives a reduction in  abdominal fat. She acknowledges an increase in sugar intake, which she believes has contributed to her weight gain. She recalls a previous period of sugar restriction that resulted in noticeable weight loss. She has been consulting with a dietitian and plans to incorporate Optifast into her breakfast and snack regimen. She is nearing the end of her current medication supply and requests a refill. She typically observes a decrease in inches before a reduction in pounds, and she can discern a difference when wearing her clothes. She expresses a desire to reduce her sugar intake and consume smaller meal portions. She also mentions fatigue associated with Fairlife protein shakes and intends to switch to Optifast, as she finds the texture of other protein shakes intolerable.   Problems being monitored/treated and associated ROS:  The following portions of the patient's history were reviewed and updated as appropriate: current medications and problem list.       Wt Readings from Last 4 Encounters:  04/07/23 97.9 kg (215 lb 12.8 oz)  01/28/23 99.3 kg (218 lb 14.7 oz)  04/16/22 92.8 kg (204 lb 9.6 oz)  04/16/22 92.8 kg (204 lb 9.6 oz)     Exam General: NAD Psych: Alert, oriented; normal behavior, judgement  Plan  1. History of sleeve gastrectomy      2. BMI 40.0-44.9, adult (CMD)      3. Dietary counseling and surveillance      4. Medication management          New Medications Ordered This Visit  Medications  . phentermine 30 mg cap    Sig: Take 1 capsule (30 mg total) by mouth daily.    Dispense:  30 capsule    Refill:  2    Medications Discontinued During This Encounter  Medication Reason  . phentermine 30 mg cap Reorder  Assessment & Plan 1. Weight management. She reports that the recent medication adjustment has been helpful in reducing cravings and improving her weight management. She has not experienced any side effects from the medication. She is advised to continue  with her current medication regimen and dietary modifications, including reducing sugar intake and incorporating high-protein foods such as [place] yogurt, hard-boiled eggs, and tuna pouches into her diet. She is also encouraged to use OPTi fast for breakfast and snacks. A prescription refill will be sent to [facility] in [place]. If there is no significant weight loss observed within the next several months, alternative therapeutic options will be considered. Pt does not have recent weight, she is at work during visit. Plans to weigh in am and send to me via portal.   Follow-up The patient will follow up 3 months, sooner if needed      I have personally spent 24 minutes involved in face-to-face and non-face-to-face activities for this patient on the day of the visit.  Professional time spent includes the following activities, in addition to those noted in the documentation: preparing to see the patient by review of history and previous tests; counseling and educating the patient; ordering medications, tests, or procedures; referring andcommunicating with other health care professionals; and documenting clinical information in the health record.

## 2023-07-06 ENCOUNTER — Other Ambulatory Visit: Payer: Self-pay | Admitting: Student

## 2023-07-06 DIAGNOSIS — F419 Anxiety disorder, unspecified: Secondary | ICD-10-CM

## 2023-07-06 DIAGNOSIS — F32A Depression, unspecified: Secondary | ICD-10-CM

## 2023-08-04 NOTE — Progress Notes (Signed)
 HPI: Patient is a pleasant 48 year old female with a history of a right total knee arthroplasty by Dr. Parks who presents today for a repeat evaluation of her left knee.  She was last seen approximately 6 months ago.  Steroid injection did provide benefit for a number of months.  She reports worsening left knee symptoms.  Was recently in [place] for a work trip.  Reports pain globally the left knee.  Worse with ADLs and ambulating.  Patient reports right knee overall is doing very well following her total knee arthroplasty.   Past Medical History:  Diagnosis Date  . Allergic rhinitis   . Fatigue   . GERD (gastroesophageal reflux disease)   . History of depression   . History of hypercholesterolemia   . History of hypertension   . Hypertension   . Migraine without aura and without status migrainosus, not intractable 05/24/2019  . Morbid obesity (CMD)   . Sleep apnea   . Snoring    Past Surgical History:  Procedure Laterality Date  . KNEE ARTHROSCOPY     Procedure: KNEE ARTHROSCOPY; right knee  . OTHER SURGICAL HISTORY     Procedure: OTHER SURGICAL HISTORY (sinusitis surgery)  . SLEEVE GASTROPLASTY N/A 03/26/2021   Procedure: SLEEVE GASTRECTOMY LAPAROSCOPIC;  Surgeon: Laurel Cheral Monte, MD;  Location: [facility] MAIN OR;  Service: General;  Laterality: N/A;  . TOTAL KNEE ARTHROPLASTY Right 11/18/2021   Procedure: TOTAL KNEE ARTHROPLASTY;  Surgeon: Parks Geofm Palma, MD;  Location: [phone] MAIN OR;  Service: Orthopedics;  Laterality: Right;  . WISDOM TOOTH EXTRACTION     Procedure: WISDOM TOOTH EXTRACTION    Current Outpatient Medications:  .  acyclovir  (ZOVIRAX ) 400 mg tablet, Take 400 mg by mouth once as needed., Disp: , Rfl:  .  albuterol  HFA (PROVENTIL  HFA;VENTOLIN  HFA;PROAIR  HFA) 90 mcg/actuation inhaler, Inhale 2 puffs every 6 (six) hours as needed for wheezing or shortness of breath., Disp: , Rfl:  .  amoxicillin  (AMOXIL ) 500 mg capsule, Take 4 capsules (2000 mg)  one hour prior to dental procedure., Disp: 8 capsule, Rfl: 0 .  busPIRone  (BUSPAR ) 5 mg tablet, Take 5 mg by mouth Once Daily., Disp: , Rfl:  .  calcium-vitamin D3-vitamin K (Calcium for Women) 500-100-40 mg-unit-mcg chew, Take 3 tablets by mouth Once Daily., Disp: , Rfl:  .  celecoxib (CeleBREX) 200 mg capsule, Take 1 capsule (200 mg total) by mouth 2 (two) times a day., Disp: 60 capsule, Rfl: 0 .  diclofenac  sodium (VOLTAREN ) 1 % gel, Apply 1 Application topically daily as needed., Disp: , Rfl:  .  fexofenadine (ALLEGRA) 180 mg tablet, Take 180 mg by mouth Once Daily., Disp: , Rfl:  .  fluticasone  propionate (FLONASE ) 50 mcg/spray nasal spray, Administer 1 spray into each nostril Once Daily., Disp: , Rfl:  .  gabapentin (NEURONTIN) 300 mg capsule, Take 1 capsule (300 mg total) by mouth 3 (three) times a day., Disp: 270 capsule, Rfl: 0 .  glucosamine-chondroitin (CIDAFLEX) 500-400 mg tab per tablet, Take 1 tablet by mouth daily as needed., Disp: , Rfl:  .  labetaloL  (NORMODYNE ) 100 mg tablet, Take 100 mg by mouth Once Daily., Disp: , Rfl:  .  medroxyPROGESTERone  (DEPO-PROVERA ) 150 mg/mL injection, Inject 150 mg into the muscle every 3 (three) months., Disp: , Rfl:  .  omeprazole (PriLOSEC) 40 mg DR capsule, Take 1 capsule (40 mg total) by mouth daily., Disp: 90 capsule, Rfl: 3 .  phentermine 30 mg cap, Take 1 capsule (30 mg total) by  mouth daily., Disp: 30 capsule, Rfl: 2 .  polyethylene glycol (MIRALAX) 17 gram powd powder, Take 17 g by mouth Once Daily., Disp: , Rfl:  .  sertraline  (ZOLOFT ) 25 mg tablet, Take 25 mg by mouth Once Daily., Disp: , Rfl:  .  methocarbamoL (ROBAXIN) 500 mg tablet, Take 500 mg by mouth 4 (four) times a day as needed. (Patient not taking: Reported on 08/04/2023), Disp: 40 tablet, Rfl: 0 .  sennosides-docusate sodium (PERICOLACE) 8.6-50 mg per tablet, Take 2 tablets by mouth 2 times daily. (Patient not taking: Reported on 08/04/2023), Disp: 100 tablet, Rfl: 1 Not on  File Social History   Socioeconomic History  . Marital status: Single    Spouse name: Not on file  . Number of children: Not on file  . Years of education: Not on file  . Highest education level: Not on file  Occupational History  . Not on file  Tobacco Use  . Smoking status: Never  . Smokeless tobacco: Never  Substance and Sexual Activity  . Alcohol use: Yes  . Drug use: No  . Sexual activity: Not on file  Other Topics Concern  . Not on file  Social History Narrative  . Not on file   Social Drivers of Health   Food Insecurity: No Food Insecurity (03/26/2021)   Received from [facility] visits prior to 03/22/2022.   Food vital sign   . Within the past 12 months, you worried that your food would run out before you got money to buy more: Never true   . Within the past 12 months, the food you bought just didn't last and you didn't have money to get more: Never true  Transportation Needs: Not on file  Safety: Unknown (04/22/2021)   Received from [facility]   HITS   . Physically Hurt: Not on file   . Insult or Talk Down To: Not on file   . Threaten Physical Harm: Not on file   . Scream or Curse: Not on file  Living Situation: Not on file   Family History  Problem Relation Name Age of Onset  . Breast cancer Mother June Vallin   . Diabetes Mother June Delagarza   . Hypertension Mother June Gilham   . High Cholesterol Mother June Thurlow   . Snoring Mother June Holzer   . Hypertension Sister Le Albe   . Snoring Sister Le Albe   . Obesity Sister Le Albe   . Diabetes Father Finlee Concepcion   . Hypertension Father Auri Jahnke   . High Cholesterol Father Cissy Galbreath   . Heart disease Father Serenitee Fuertes   . Snoring Father Iolanda Folson   . Hypertension Brother Deward Bachelor   . Snoring Brother Deward Bachelor   . Obesity Brother Deward Bachelor   . Hypertension Brother    . Snoring Brother    . Obesity Brother    . Dementia Maternal Grandmother Zella Brewster   .  Arthritis Maternal Grandmother Zella Brewster   . Stroke Maternal Grandfather Eleni Bachelor   . Depression Maternal Aunt    . Seizures Neg Hx    . Parkinsonism Neg Hx    . Neuropathy Neg Hx    . Multiple sclerosis Neg Hx    . Migraines Neg Hx    . Anesthesia problems Neg Hx      Complete ROS has been performed and is negative except as stated above  Vitals:   08/04/23 1559  BP: 136/72  Pulse: 82    PE: Alert and oriented x 3, no acute distress Neurovascularly intact left lower extremity Positive antalgic gait with stance phase left lower extremity  Left knee: Range of motion: 0-115 Positive varus alignment Manual muscle test: 5/5 Tenderness to palpation: Medial joint line Positive crepitance No gross instability   Previous X-ray of the left knee demonstrates tricompartmental degenerative changes that are moderate to severe along the medial compartment.  Significant joint space narrowing, sclerosis and osteophytes noted.  No acute fracture. Personal interpretation  A:  1. Primary osteoarthritis of left knee  Large joint arthrocentesis: L knee       P: --Repeat left knee steroid injection --Patient will reach out to Dr. Parks when she is interested in pursuing left total knee arthroplasty --She will work on weight reduction and water-based exercises in the interim --63-month handicap placard renewal approved   Large joint arthrocentesis: L knee on 08/04/2023 3:40 PMIndications: pain Details: 22 G needle, anterolateral approach Medications: 4 mL lidocaine  10 mg/mL (1 %); 40 mg triamcinolone acetonide 40 mg/mL Outcome: tolerated well, no immediate complications Procedure, treatment alternatives, risks and benefits explained, specific risks discussed. Consent was given by the patient. Immediately prior to procedure a time out was called to verify the correct patient, procedure, equipment, support staff and site/side marked as required. Patient was prepped and  draped in the usual sterile fashion.

## 2023-08-10 ENCOUNTER — Ambulatory Visit

## 2023-08-11 ENCOUNTER — Ambulatory Visit (INDEPENDENT_AMBULATORY_CARE_PROVIDER_SITE_OTHER)

## 2023-08-11 VITALS — BP 138/82 | HR 88 | Ht 60.0 in | Wt 218.8 lb

## 2023-08-11 DIAGNOSIS — A6000 Herpesviral infection of urogenital system, unspecified: Secondary | ICD-10-CM

## 2023-08-11 DIAGNOSIS — Z3042 Encounter for surveillance of injectable contraceptive: Secondary | ICD-10-CM | POA: Diagnosis not present

## 2023-08-11 DIAGNOSIS — F5105 Insomnia due to other mental disorder: Secondary | ICD-10-CM | POA: Diagnosis not present

## 2023-08-11 DIAGNOSIS — F99 Mental disorder, not otherwise specified: Secondary | ICD-10-CM

## 2023-08-11 DIAGNOSIS — F419 Anxiety disorder, unspecified: Secondary | ICD-10-CM

## 2023-08-11 DIAGNOSIS — F32A Depression, unspecified: Secondary | ICD-10-CM | POA: Diagnosis not present

## 2023-08-11 LAB — POCT URINE PREGNANCY: Preg Test, Ur: NEGATIVE

## 2023-08-11 MED ORDER — MEDROXYPROGESTERONE ACETATE 150 MG/ML IM SUSY
150.0000 mg | PREFILLED_SYRINGE | Freq: Once | INTRAMUSCULAR | Status: AC
  Administered 2023-08-11: 150 mg via INTRAMUSCULAR

## 2023-08-11 MED ORDER — BUSPIRONE HCL 5 MG PO TABS: 5.0000 mg | ORAL_TABLET | Freq: Two times a day (BID) | ORAL | 0 refills | Status: DC

## 2023-08-11 MED ORDER — SERTRALINE HCL 100 MG PO TABS: 100.0000 mg | ORAL_TABLET | Freq: Every day | ORAL | 3 refills | Status: DC

## 2023-08-11 MED ORDER — VALACYCLOVIR HCL 500 MG PO TABS: 500.0000 mg | ORAL_TABLET | Freq: Every day | ORAL | 1 refills | Status: DC

## 2023-08-11 MED ORDER — PROPRANOLOL HCL 10 MG PO TABS: 10.0000 mg | ORAL_TABLET | Freq: Every day | ORAL | 0 refills | Status: DC | PRN

## 2023-08-11 NOTE — Assessment & Plan Note (Signed)
 Patient desires repeat Depo shot today. 2 months out of date. UPT ordered and is negative. Depo administered.

## 2023-08-11 NOTE — Assessment & Plan Note (Signed)
 GAD 7 score 21. See depression below for plan.

## 2023-08-11 NOTE — Assessment & Plan Note (Signed)
 PHQ9 scored 23 with significant affect on daily function.  Extensive time spent in counseling today. No suicidal ideations with many reasons for living and environmental changes.  She will continue talking with her counselor weekly and more as needed.  Increased Zoloft  to 100 mg every day. Continue Buspar  5 mg BID. Continue Propranolol  10 mg every day PRN. Refills provided. Provided a letter to be out of work for the next 2 weeks. Patient will bring in FMLA paperwork in the meantime.  Follow up in 2 weeks for reevaluation.

## 2023-08-11 NOTE — Patient Instructions (Addendum)
 It was wonderful to see you today.  Please bring ALL of your medications with you to every visit.   Today we talked about:  We increased your medication, Zoloft . You can take Benedryl as needed for sleep at night, but please don't take other medications.  I have refilled your other prescriptions, and those will be the same.  Follow up in 2 weeks.  Thank you for choosing [facility].   Please call [phone] with any questions about today's appointment.  Please arrive at least 15 minutes prior to your scheduled appointments.   If you need additional refills before your next appointment, please call your pharmacy first.   Nyia Tsao Alena Morrison, MD  [facility]

## 2023-08-11 NOTE — Progress Notes (Signed)
    SUBJECTIVE:   CHIEF COMPLAINT / HPI:   She got a new boss at work in 2023 and has incredible stress related to her job. She struggles with sleep on nights where she has to go to work because anxiety and restlessness.  She takes Tylenol  PM, Benadryl, and a muscle relaxer from her mom to help her sleep at night.  She has been in bed for hours until she falls asleep around 1 or 2 AM.  She has been unable to maintain cleanliness in her house given her symptoms of anxiety and depression.  She is unable to perform activities that she enjoys due to her symptoms.  She reports that when she is on vacation away from work she does well and does not have symptoms of anxiety or depression today, but when she returns to work her symptoms return.  She notices that her overall health is worsening in association with her symptoms.  She has significant muscle aches and daily headaches.  Her blood pressure is creeping up. Her weight is increasing. She is less active and has more joint and muscle pain.  She has a Veterinary surgeon who she sees weekly and has a cell number for more frequent contacts as needed.  LMP 08/04/23, has not been sexually active since then  PERTINENT  PMH / PSH: Hypertension, migraine, osteoarthritis knee, PT, depression, anxiety, obesity   OBJECTIVE:   BP 138/82   Pulse 88   Ht 5' (1.524 m)   Wt 218 lb 12.8 oz (99.2 kg)   SpO2 100%   BMI 42.73 kg/m   Psych:  Appearance is appropriate, she is appropriately tearful during encounter, increased fidgeting at times, speech is of normal speed, affect is sad, thought process is minor directed, no suicidal or homicidal ideations, patient has excellent insight into her condition, patient indicates very good judgment and future planning  ASSESSMENT/PLAN:   Assessment & Plan Anxiety GAD 7 score 21. See depression below for plan. Depression, unspecified depression type PHQ9 scored 23 with significant affect on daily function.  Extensive time  spent in counseling today. No suicidal ideations with many reasons for living and environmental changes.  She will continue talking with her counselor weekly and more as needed.  Increased Zoloft  to 100 mg every day. Continue Buspar  5 mg BID. Continue Propranolol  10 mg every day PRN. Refills provided. Provided a letter to be out of work for the next 2 weeks. Patient will bring in FMLA paperwork in the meantime.  Follow up in 2 weeks for reevaluation. Insomnia due to other mental disorder Suspect insomnia is due to GAD.  Recommended occasional use of Benedryl 50 mg at bedtime for sleep aid.  Advised discontinuing Tylenol  PM as it has additional diphenhydramine component. D/C use of mother's muscle relaxant. Recurrent genital herpes Refilled valtrex  Depo-Provera  contraceptive status Patient desires repeat Depo shot today. 2 months out of date. UPT ordered and is negative. Depo administered.     Galileah Piggee Alena Morrison, MD [facility]

## 2023-08-13 ENCOUNTER — Telehealth: Payer: Self-pay

## 2023-08-13 ENCOUNTER — Ambulatory Visit

## 2023-08-13 NOTE — Telephone Encounter (Signed)
 Patient dropped off form at front desk for [facility].  Verified that patient section of form has been completed.  Last DOS/WCC with PCP was July 22,2025.  Placed form in [facility] team folder to be completed by clinical staff.  Burnard Gander   Thank You!

## 2023-08-13 NOTE — Telephone Encounter (Signed)
Placed some forms in your box that the patient needs completed.

## 2023-08-17 ENCOUNTER — Telehealth: Payer: Self-pay

## 2023-08-17 NOTE — Telephone Encounter (Signed)
 Form placed up front for pick up.   Copy made for batch scanning.   Attempted to contact patient, however no answer.   Please let her know her forms are ready for pick up if/when she calls back.

## 2023-08-17 NOTE — Telephone Encounter (Signed)
 Patient came to pick up her paper work the date was incorrect, ask for the doctor to please correct the form. Please call if there is any questions and place back into providers box.  Thank you!

## 2023-08-17 NOTE — Telephone Encounter (Signed)
 Contacted the patient and she stated that you already wrote her out for 2 weeks on the 22nd of this month so she stated that you can put the date 08/5. Please contact the patient if you need anymore information.

## 2023-08-17 NOTE — Telephone Encounter (Signed)
 Updated FMLA paperwork to reflect the patient's expected incapacitation from 08/10/2023 until 11/10/2023.

## 2023-08-17 NOTE — Telephone Encounter (Signed)
 Patient stated that you filled out her form when I called her.  Patient stated you filled it out at her last visit with you.

## 2023-08-18 NOTE — Telephone Encounter (Signed)
 Called patient and advised of updated paperwork completion.   Copy made and placed in batch scanning.   Original at front desk for pick up.   Chiquita JAYSON English, RN

## 2023-08-27 ENCOUNTER — Ambulatory Visit: Payer: Self-pay

## 2023-08-27 VITALS — BP 131/83 | HR 93 | Ht 60.0 in | Wt 215.4 lb

## 2023-08-27 DIAGNOSIS — F32A Depression, unspecified: Secondary | ICD-10-CM

## 2023-08-27 DIAGNOSIS — F99 Mental disorder, not otherwise specified: Secondary | ICD-10-CM

## 2023-08-27 DIAGNOSIS — F419 Anxiety disorder, unspecified: Secondary | ICD-10-CM | POA: Diagnosis not present

## 2023-08-27 DIAGNOSIS — F5105 Insomnia due to other mental disorder: Secondary | ICD-10-CM | POA: Diagnosis not present

## 2023-08-27 NOTE — Assessment & Plan Note (Addendum)
 PHQ 9: 12, improving Keep zoloft  at 100 mg at this time. Reevaluate efficacy at follow up in 4 weeks. Continue with therapy. Continue with job hunt.

## 2023-08-27 NOTE — Patient Instructions (Signed)
 It was good to see you today!  Keep taking the zoloft  100 mg. You can request a refill from the pharmacy when you need more. Glad you are connected with your therapist and on the job hunt.  I will see you back in September.   Best, Dr. Alena

## 2023-08-27 NOTE — Assessment & Plan Note (Addendum)
 GAD 7: 11, improving As above

## 2023-08-27 NOTE — Progress Notes (Cosign Needed Addendum)
    SUBJECTIVE:   CHIEF COMPLAINT / HPI:   Last seen on 08/11/23 where she was significantly struggling with anxiety, depression and difficulty sleeping. Increased her Zoloft  to 100 mg. In the interim I filled out FMLA paperwork. Today, she reports overall her mood is improved since the last visit and being out of work. She has been able to sleep better and feels more rested today. Has taken Benadryl very occasionally for sleep in the past two weeks and has not used any other medications as a sleep aid. She does use Restful legs PM otc at night. She has been in touch with her therapist and making progress on a new job hunt.  PERTINENT  PMH / PSH: HTN, migraine   OBJECTIVE:   BP 131/83   Pulse 93   Ht 5' (1.524 m)   Wt 215 lb 6.4 oz (97.7 kg)   SpO2 100%   BMI 42.07 kg/m    Psych: Appearance is appropriate, Mood is better, affect is appropriate, speech is normal speed, goal directed, thought process is linear, Patient continues to have good insight into her condition, good judgement, no SI/HI  ASSESSMENT/PLAN:   Assessment & Plan Depression, unspecified depression type PHQ 9: 12, improving Keep zoloft  at 100 mg at this time. Reevaluate efficacy at follow up in 4 weeks. Continue with therapy. Continue with job hunt. Anxiety GAD 7: 11, improving As above Insomnia due to other mental disorder Continue to suspect due to GAD, MDD. Improving with management as above. May use Benadryl as needed for sleep as discussed previously.    Follow up in 4 weeks for clinical reassessment.   Evona Westra Alena Morrison, MD [facility]

## 2023-09-24 ENCOUNTER — Ambulatory Visit (INDEPENDENT_AMBULATORY_CARE_PROVIDER_SITE_OTHER): Payer: Self-pay

## 2023-09-24 VITALS — BP 124/78 | HR 94 | Wt 218.0 lb

## 2023-09-24 DIAGNOSIS — Z23 Encounter for immunization: Secondary | ICD-10-CM

## 2023-09-24 DIAGNOSIS — G4733 Obstructive sleep apnea (adult) (pediatric): Secondary | ICD-10-CM | POA: Insufficient documentation

## 2023-09-24 DIAGNOSIS — F5105 Insomnia due to other mental disorder: Secondary | ICD-10-CM

## 2023-09-24 DIAGNOSIS — F32A Depression, unspecified: Secondary | ICD-10-CM

## 2023-09-24 DIAGNOSIS — F419 Anxiety disorder, unspecified: Secondary | ICD-10-CM

## 2023-09-24 DIAGNOSIS — F99 Mental disorder, not otherwise specified: Secondary | ICD-10-CM

## 2023-09-24 MED ORDER — PROPRANOLOL HCL 10 MG PO TABS
10.0000 mg | ORAL_TABLET | Freq: Every day | ORAL | 2 refills | Status: AC | PRN
Start: 2023-09-24 — End: ?

## 2023-09-24 MED ORDER — SERTRALINE HCL 100 MG PO TABS: 150.0000 mg | ORAL_TABLET | Freq: Every day | ORAL | 1 refills | Status: DC

## 2023-09-24 NOTE — Progress Notes (Signed)
    SUBJECTIVE:   CHIEF COMPLAINT / HPI: depression/anxiety, sleep concerns  Depression/anxiety She did not get the job that she thought she might have last week. This caused her to relapse in some of her anxiety progress and insomnia. Taking zoloft  100 mg daily. Continues in counseling regularly. She continues to have low energy, but since her daughter went back to school, she has been getting on a little bit more of a schedule. Not currently exercising. Oct 21 is the end of her FMLA. Almost out of her propranolol .  Sleep concerns Trouble falling asleep at night due to restlessness, racing thoughts. She gets in bed at 10 pm but will turn on the TV. Sometimes does not fall asleep until 3 am. Taking tylenol  PM on occasion. Not a lot of caffeine use during the day. No journaling. Has history of sleep apnea. Does not use CPAP as unable to tolerate it.   Health Maintenance Mammogram: last in November 2023 with left breast mass requiring further imaging.  Pap smear: last was negative in November 2025  PERTINENT  PMH / PSH: OSA, GAD, MDD  OBJECTIVE:   BP 124/78   Pulse 94   Wt 218 lb (98.9 kg)   SpO2 100%   BMI 42.58 kg/m    Psych: Good insight and judgement. Speech: goal oriented, rate appropriate. Full affect.   ASSESSMENT/PLAN:   Assessment & Plan Anxiety GAD 7: 9, improving Continues to have restlessness that is particularly affecting her sleep. Takes hours to fall asleep at night. Continue with counseling. Discussed journaling to help with restlessness, racing thoughts, particularly at night.  Increase zoloft  to 150 mg daily.  Depression, unspecified depression type PHQ9: 10, improving Getting on a better schedule but still struggles with energy/motivation/home tasks. We set goals for the next month: - Work on Psychologist, sport and exercise office space today/this week. - Get back into morning water aerobics in the next couple of weeks. Increasing Zoloft . Insomnia due to other mental  disorder Trouble falling asleep due to restlessness. Optimizing GAD/MDD treatment. Sleep hygiene discussed Holding off on insomnia medications as we try to address underlying condition. OSA (obstructive sleep apnea) Has been diagnosed with a sleep study in the past.  Does not use CPAP.  Briefly discussed side effects of untreated sleep apnea Reevaluate at future visit.  Encounter for immunization Due for flu. Administered in office today.   Follow up in four weeks. Will need to Schedule appointment for pap/annual in November at the next visit.  Johany Hansman Alena Morrison, MD [facility]

## 2023-09-24 NOTE — Assessment & Plan Note (Signed)
 GAD 7: 9, improving Continues to have restlessness that is particularly affecting her sleep. Takes hours to fall asleep at night. Continue with counseling. Discussed journaling to help with restlessness, racing thoughts, particularly at night.  Increase zoloft  to 150 mg daily.

## 2023-09-24 NOTE — Assessment & Plan Note (Signed)
 PHQ9: 10, improving Getting on a better schedule but still struggles with energy/motivation/home tasks. We set goals for the next month: - Work on Psychologist, sport and exercise office space today/this week. - Get back into morning water aerobics in the next couple of weeks. Increasing Zoloft .

## 2023-09-24 NOTE — Patient Instructions (Addendum)
-   Try the following to help you sleep better:  - limit naps during the day  - no screens (TV, phone, tablet, computer) at least 1-2 hours before bedtime.  - have a quiet and dark sleeping environment.  - no large meals or drinks about 1 hour before bed.  - Avoid caffeine after 3pm.  - Exercise or move your body regularly every day.  - You can also try melatonin 5 mg over the counter. Take this 1-2 hours before bed. - If you are lying in bed for 30 mins-1 hour and aren't falling asleep, get out of bed and do something relaxing like reading (NO TV!) until you are tired.  Goals: -Work on Research scientist (life sciences) space today/this week. - Get back into morning water aerobics in the next couple of weeks.  I increased your zoloft  to 150 mg daily. Refilled the propranolol  for as needed.  I will see you back at the end of September to see how you are doing with your depression and anxiety.  In November, we will get together for an annual health visit (pap smear, mammogram, etc).   Best, Elio Art, MD

## 2023-09-24 NOTE — Assessment & Plan Note (Addendum)
 Has been diagnosed with a sleep study in the past.  Does not use CPAP.  Briefly discussed side effects of untreated sleep apnea Reevaluate at future visit.

## 2023-10-19 ENCOUNTER — Ambulatory Visit (INDEPENDENT_AMBULATORY_CARE_PROVIDER_SITE_OTHER): Payer: Self-pay

## 2023-10-19 VITALS — BP 130/61 | HR 98 | Ht 60.0 in | Wt 218.5 lb

## 2023-10-19 DIAGNOSIS — F419 Anxiety disorder, unspecified: Secondary | ICD-10-CM | POA: Diagnosis not present

## 2023-10-19 DIAGNOSIS — F32A Depression, unspecified: Secondary | ICD-10-CM | POA: Diagnosis not present

## 2023-10-19 MED ORDER — BUSPIRONE HCL 5 MG PO TABS: 5.0000 mg | ORAL_TABLET | Freq: Two times a day (BID) | ORAL | 1 refills | Status: DC

## 2023-10-19 NOTE — Progress Notes (Signed)
    SUBJECTIVE:   CHIEF COMPLAINT / HPI:   Her daytime anxiety/worrying has picked up a bit in the last couple of weeks since she is getting close to the end of her FMLA (October 21) and does not have a another job secured.  She has several leads but does not expect to hear back for the next couple weeks.  Her sleep seems to be improving.  She has noticed that when she worries during the day, she will try to go to sleep and feels that that affects her nighttime sleep.  She has made some progress with our positive activity planning.  She has done some decluttering, but her mom is coming this week to help with more decluttering.  She has done occasional exercise but nothing consistent throughout the week.  She is taking her medications as follows: Zoloft  150 mg every day Buspar  once daily Propranolol  2 times per week Gabapentin about twice per week at night  She is looking into short term disability if she cannot line up a job prior to October 21.  PERTINENT  PMH / PSH: Depression anxiety  OBJECTIVE:   BP 130/61   Pulse 98   Ht 5' (1.524 m)   Wt 218 lb 8 oz (99.1 kg)   SpO2 99%   BMI 42.67 kg/m    General: Well-appearing female in no acute distress Psych: Thoughts are goal oriented.  Good insight.  No SI.  Affect is appropriate.  ASSESSMENT/PLAN:   Assessment & Plan Anxiety GAD 7: 13, worsened We made an exercise goal: Walk and stretch 2-3 times per week for 15-30 minutes at a time  Discussed exercise and journaling for management of symptoms.  She is not inclined to journal at this time. Continue Zoloft  150 mg once a day She has only been taking BuSpar  5 mg once a day.  We discussed that it is a very low dose and prescribed for twice a day, I refilled this today.  She will start taking this twice a day. She uses propranolol  and gabapentin at night sparingly for as needed anxiety.  These are medications she was previously on prior to current titration of medications.    Depression, unspecified depression type PHQ 9: 10, stable Continue Zoloft  150 mg once a day   Follow-up with me on October 31.  If I have an availability sooner to that, she will try to schedule an appointment earlier, prior to returning to work on Oct. 21.  Amy Miller Amy Morrison, MD [facility]

## 2023-10-19 NOTE — Patient Instructions (Addendum)
 Walk and stretch 2-3 times per week for 15-30 minutes at a time. Still working on on decluttering! Mom is coming to help.   Zoloft  150 mg every day Buspar /buspirone  5 mg twice a day Propranolol  and gabapentin as needed. Be wary of stacking the medications if they cause sleepiness/sedation

## 2023-10-19 NOTE — Assessment & Plan Note (Addendum)
 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  She is not inclined to journal at this time. Continue Zoloft  150 mg once a day She has only been taking BuSpar  5 mg once a day.  We discussed that it is a very low dose and prescribed for twice a day, I refilled this today.  She will start taking this twice a day. She uses propranolol  and gabapentin at night sparingly for as needed anxiety.  These are medications she was previously on prior to current titration of medications.

## 2023-10-19 NOTE — Assessment & Plan Note (Addendum)
 PHQ 9: 10, stable Continue Zoloft  150 mg once a day

## 2023-11-18 NOTE — Progress Notes (Signed)
 HPI: Very pleasant 48 year old female returns today for an evaluation of her left knee.  She has known left knee DJD.  She was seen 3-1/2 months ago.  Steroid injections helped for approximately 1 month and then her symptoms slowly returned.  We have discussed total knee arthroplasty as definitive treatment.  At some point she would like to follow-up with Dr. Parks to discuss this in further detail.  She has a history of a right total knee arthroplasty with her and has done well with this.  She has recently return to the gym.  She is looking at water-based exercises.  Medical History[1] Surgical History[2] Current Medications[3] Allergies[4] Social History   Socioeconomic History  . Marital status: Single    Spouse name: Not on file  . Number of children: Not on file  . Years of education: Not on file  . Highest education level: Not on file  Occupational History  . Not on file  Tobacco Use  . Smoking status: Never  . Smokeless tobacco: Never  Substance and Sexual Activity  . Alcohol use: Yes  . Drug use: No  . Sexual activity: Not on file  Other Topics Concern  . Not on file  Social History Narrative  . Not on file   Social Drivers of Health   Food Insecurity: No Food Insecurity (03/26/2021)   Received from [facility] visits prior to 03/22/2022.   Food vital sign   . Within the past 12 months, you worried that your food would run out before you got money to buy more: Never true   . Within the past 12 months, the food you bought just didn't last and you didn't have money to get more: Never true  Transportation Needs: Not on file  Safety: Unknown (04/22/2021)   Received from [facility]   HITS   . Physically Hurt: Not on file   . Insult or Talk Down To: Not on file   . Threaten Physical Harm: Not on file   . Scream or Curse: Not on file  Living Situation: Not on file   Family History[5]  Complete ROS has been performed and is negative except as  stated above Vitals:   11/18/23 1548  BP: (!) 139/96  Pulse: 97   Alert and oriented x 3.  No acute distress at rest.  Appropriate mood.  Positive antalgic gait.  Neurovasc intact left lower extremity.  Left knee demonstrates positive varus alignment.  Positive crepitance with open chain knee extension.  Slight increased translation with a valgus tress at 30 degrees but no gross instability noted.  Tenderness along the medial joint line.  Range of motion is 0 to 120 degrees with discomfort at end range flexion.  Strength overall appropriate.  Previous films again reviewed and demonstrates tricompartmental degenerative changes that are severe along the medial compartment  Assessment 1. Primary osteoarthritis of left knee      2. BMI 40.0-44.9, adult (CMD)       P: -- We agreed to repeat a steroid injection in the left knee.  We discussed target weight goal of 205 pounds.  We discussed low carbohydrate and high lean protein diet with vegetables. --Patient will utilize the gym for water-based exercises --Will have her follow-up with Dr. Parks in approximately 4 months for an evaluation.  If she reaches her target weight goal between now and then she will reach out to me and we may help coordinate a surgical date in the interim  Large joint arthrocentesis: L knee on 11/18/2023 3:20 PMIndications: pain Details: 22 G needle, anterolateral approach Medications: 4 mL lidocaine  10 mg/mL (1 %); 40 mg triamcinolone acetonide 40 mg/mL Outcome: tolerated well, no immediate complications Procedure, treatment alternatives, risks and benefits explained, specific risks discussed. Consent was given by the patient. Immediately prior to procedure a time out was called to verify the correct patient, procedure, equipment, support staff and site/side marked as required. Patient was prepped and draped in the usual sterile fashion.           [1] Past Medical History: Diagnosis Date  . Allergic  rhinitis   . Fatigue   . GERD (gastroesophageal reflux disease)   . History of depression   . History of hypercholesterolemia   . History of hypertension   . Hypertension   . Migraine without aura and without status migrainosus, not intractable 05/24/2019  . Morbid obesity (CMD)   . Sleep apnea   . Snoring   [2] Past Surgical History: Procedure Laterality Date  . KNEE ARTHROSCOPY     Procedure: KNEE ARTHROSCOPY; right knee  . OTHER SURGICAL HISTORY     Procedure: OTHER SURGICAL HISTORY (sinusitis surgery)  . SLEEVE GASTROPLASTY N/A 03/26/2021   Procedure: SLEEVE GASTRECTOMY LAPAROSCOPIC;  Surgeon: Laurel Cheral Monte, MD;  Location: [facility] MAIN OR;  Service: General;  Laterality: N/A;  . TOTAL KNEE ARTHROPLASTY Right 11/18/2021   Procedure: TOTAL KNEE ARTHROPLASTY;  Surgeon: Parks Geofm Palma, MD;  Location: [phone] MAIN OR;  Service: Orthopedics;  Laterality: Right;  . WISDOM TOOTH EXTRACTION     Procedure: WISDOM TOOTH EXTRACTION  [3]  Current Outpatient Medications:  .  acyclovir  (ZOVIRAX ) 400 mg tablet, Take 400 mg by mouth once as needed., Disp: , Rfl:  .  albuterol  HFA (PROVENTIL  HFA;VENTOLIN  HFA;PROAIR  HFA) 90 mcg/actuation inhaler, Inhale 2 puffs every 6 (six) hours as needed for wheezing or shortness of breath., Disp: , Rfl:  .  amoxicillin  (AMOXIL ) 500 mg capsule, Take 4 capsules (2000 mg) one hour prior to dental procedure., Disp: 8 capsule, Rfl: 0 .  busPIRone  (BUSPAR ) 5 mg tablet, Take 5 mg by mouth Once Daily., Disp: , Rfl:  .  calcium-vitamin D3-vitamin K (Calcium for Women) 500-100-40 mg-unit-mcg chew, Take 3 tablets by mouth Once Daily., Disp: , Rfl:  .  celecoxib (CeleBREX) 200 mg capsule, Take 1 capsule (200 mg total) by mouth 2 (two) times a day., Disp: 60 capsule, Rfl: 0 .  fexofenadine (ALLEGRA) 180 mg tablet, Take 180 mg by mouth Once Daily., Disp: , Rfl:  .  fluticasone  propionate (FLONASE ) 50 mcg/spray nasal spray, Administer 1 spray into each nostril Once  Daily., Disp: , Rfl:  .  gabapentin (NEURONTIN) 300 mg capsule, Take 1 capsule (300 mg total) by mouth 3 (three) times a day., Disp: 270 capsule, Rfl: 0 .  glucosamine-chondroitin (CIDAFLEX) 500-400 mg tab per tablet, Take 1 tablet by mouth daily as needed., Disp: , Rfl:  .  labetaloL  (NORMODYNE ) 100 mg tablet, Take 100 mg by mouth Once Daily., Disp: , Rfl:  .  medroxyPROGESTERone  (DEPO-PROVERA ) 150 mg/mL injection, Inject 150 mg into the muscle every 3 (three) months., Disp: , Rfl:  .  methocarbamoL (ROBAXIN) 500 mg tablet, Take 500 mg by mouth 4 (four) times a day as needed., Disp: 40 tablet, Rfl: 0 .  omeprazole (PriLOSEC) 40 mg DR capsule, Take 1 capsule (40 mg total) by mouth daily., Disp: 90 capsule, Rfl: 3 .  phentermine 30 mg cap, Take 1 capsule (30  mg total) by mouth daily., Disp: 30 capsule, Rfl: 2 .  sertraline  (ZOLOFT ) 25 mg tablet, Take 25 mg by mouth Once Daily., Disp: , Rfl:  .  diclofenac  sodium (VOLTAREN ) 1 % gel, Apply 1 Application topically daily as needed. (Patient not taking: Reported on 11/18/2023), Disp: , Rfl:  .  polyethylene glycol (MIRALAX) 17 gram powd powder, Take 17 g by mouth Once Daily. (Patient not taking: Reported on 11/18/2023), Disp: , Rfl:  .  sennosides-docusate sodium (PERICOLACE) 8.6-50 mg per tablet, Take 2 tablets by mouth 2 times daily. (Patient not taking: Reported on 11/18/2023), Disp: 100 tablet, Rfl: 1 [4] Not on File [5] Family History Problem Relation Name Age of Onset  . Breast cancer Mother June Alderman   . Diabetes Mother June Guedea   . Hypertension Mother June Mood   . High Cholesterol Mother June Coppock   . Snoring Mother June Standifer   . Hypertension Sister Le Albe   . Snoring Sister Le Albe   . Obesity Sister Le Albe   . Diabetes Father Preston Weill   . Hypertension Father Necola Bluestein   . High Cholesterol Father Merla Sawka   . Heart disease Father Ravon Mcilhenny   . Snoring Father Bill Mcvey   . Hypertension Brother Deward Bachelor   .  Snoring Brother Deward Bachelor   . Obesity Brother Deward Bachelor   . Hypertension Brother    . Snoring Brother    . Obesity Brother    . Dementia Maternal Grandmother Zella Brewster   . Arthritis Maternal Grandmother Zella Brewster   . Stroke Maternal Grandfather Eleni Bachelor   . Depression Maternal Aunt    . Seizures Neg Hx    . Parkinsonism Neg Hx    . Neuropathy Neg Hx    . Multiple sclerosis Neg Hx    . Migraines Neg Hx    . Anesthesia problems Neg Hx

## 2023-11-20 ENCOUNTER — Ambulatory Visit: Payer: Self-pay

## 2023-12-22 ENCOUNTER — Other Ambulatory Visit: Payer: Self-pay

## 2023-12-22 DIAGNOSIS — F32A Depression, unspecified: Secondary | ICD-10-CM

## 2023-12-25 ENCOUNTER — Other Ambulatory Visit: Payer: Self-pay

## 2023-12-25 DIAGNOSIS — F32A Depression, unspecified: Secondary | ICD-10-CM

## 2024-01-11 ENCOUNTER — Other Ambulatory Visit: Payer: Self-pay

## 2024-01-11 DIAGNOSIS — A6000 Herpesviral infection of urogenital system, unspecified: Secondary | ICD-10-CM

## 2024-01-18 ENCOUNTER — Other Ambulatory Visit: Payer: Self-pay

## 2024-01-20 ENCOUNTER — Telehealth: Payer: Self-pay

## 2024-01-20 NOTE — Telephone Encounter (Signed)
 Received letter from patient's insurance regarding no vit B12/iron screen in last 12 months (patient s/p sleeve gastrectomy in 2023). Reached out to patient to see if another provider is managing this. If not will place lab order for appropriate labs.

## 2024-01-31 ENCOUNTER — Other Ambulatory Visit: Payer: Self-pay

## 2024-01-31 DIAGNOSIS — F32A Depression, unspecified: Secondary | ICD-10-CM
